# Patient Record
Sex: Female | Born: 1960 | Race: Black or African American | Hispanic: No | Marital: Married | Smoking: Former smoker
Health system: Southern US, Community
[De-identification: ages and names within clinical notes are randomized; demographics above are authoritative.]

## PROBLEM LIST (undated history)

## (undated) DIAGNOSIS — E119 Type 2 diabetes mellitus without complications: Secondary | ICD-10-CM

## (undated) DIAGNOSIS — I10 Essential (primary) hypertension: Secondary | ICD-10-CM

---

## 2005-10-16 ENCOUNTER — Other Ambulatory Visit: Payer: Self-pay

## 2005-10-16 ENCOUNTER — Emergency Department: Payer: Self-pay | Admitting: Emergency Medicine

## 2006-02-09 ENCOUNTER — Emergency Department: Payer: Self-pay | Admitting: Unknown Physician Specialty

## 2006-02-21 ENCOUNTER — Ambulatory Visit: Payer: Self-pay | Admitting: Physician Assistant

## 2006-03-09 ENCOUNTER — Ambulatory Visit: Payer: Self-pay | Admitting: Orthopaedic Surgery

## 2007-03-10 IMAGING — CT CT CHEST W/ CM
1 series · 15 of 32 positions shown, 19 images · non-contrast
Comparison: none

REASON FOR EXAM: pe protcol high d dimer :rm 19
COMMENTS:

[Series 4: soft tissue · axial · 0.62mm/px · z∈[-596,-344]mm · 15 of 96 slices shown, 19 images]
[im 8/96  mediastinal]
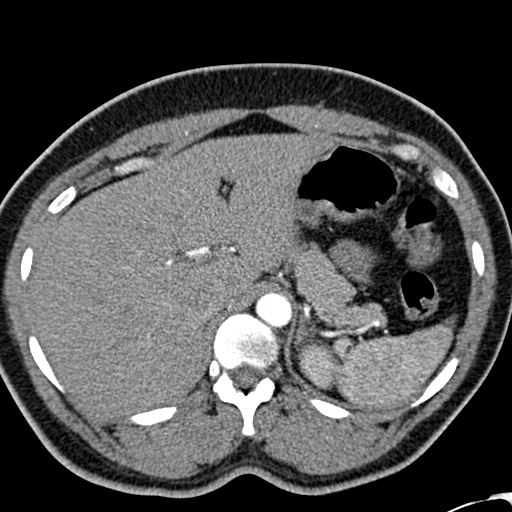
[im 8/96  lung]
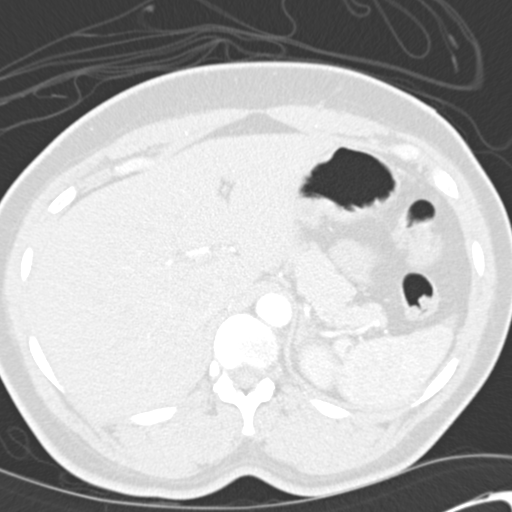
[im 15/96  lung]
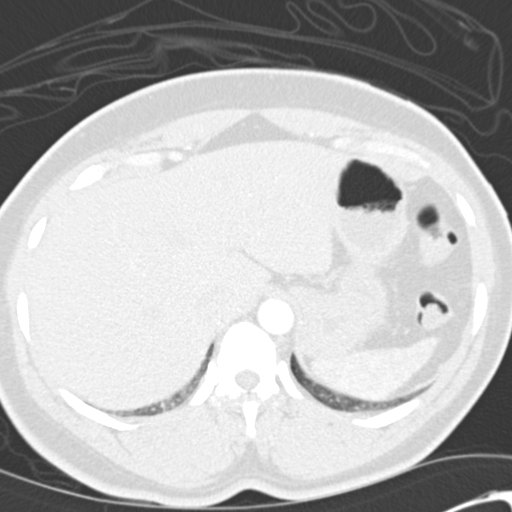
[im 20/96  lung]
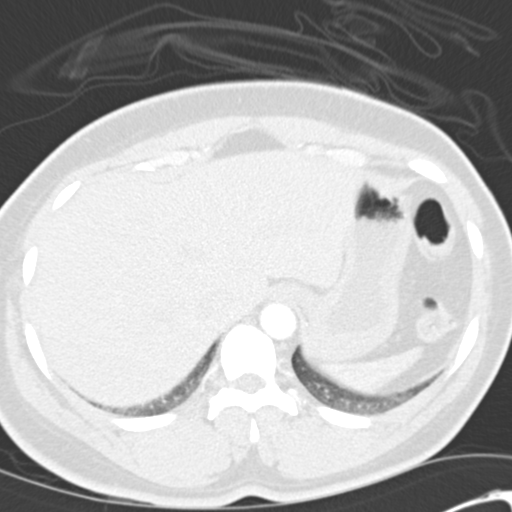
[im 25/96  lung]
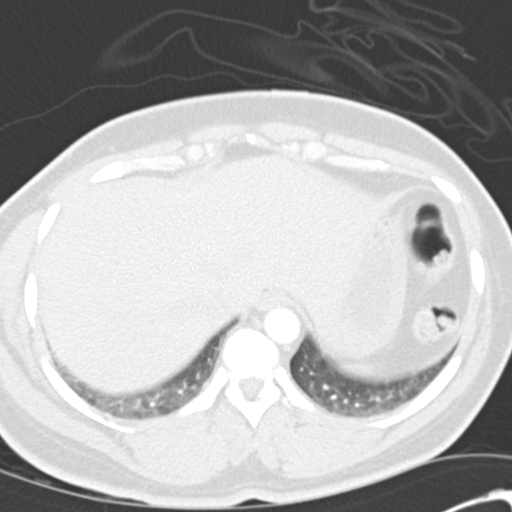
[im 32/96  mediastinal]
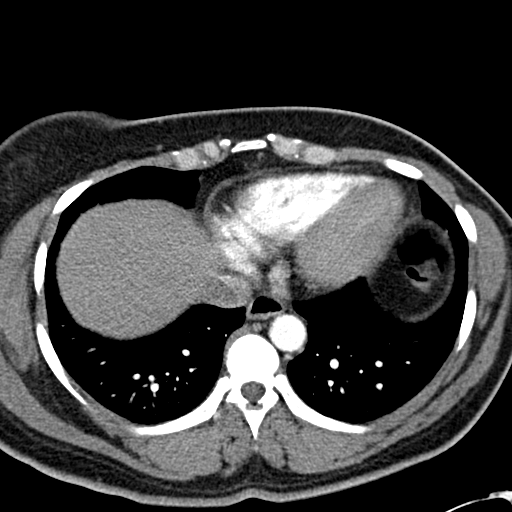
[im 32/96  lung]
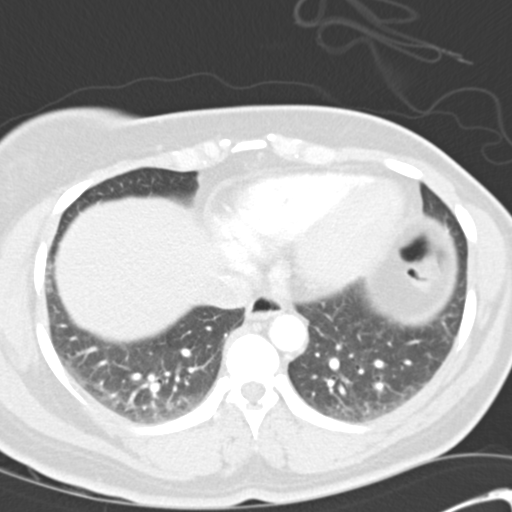
[im 39/96  lung]
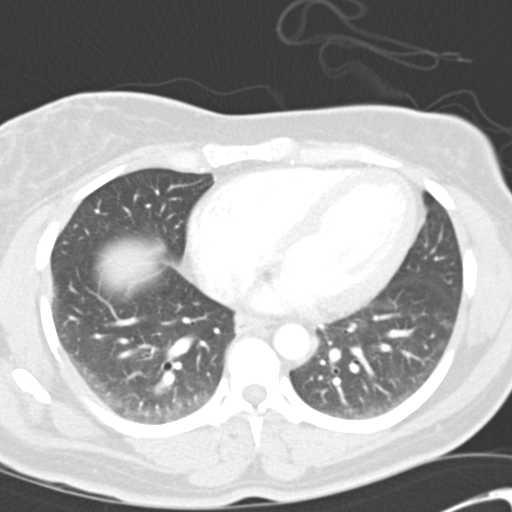
[im 46/96  lung]
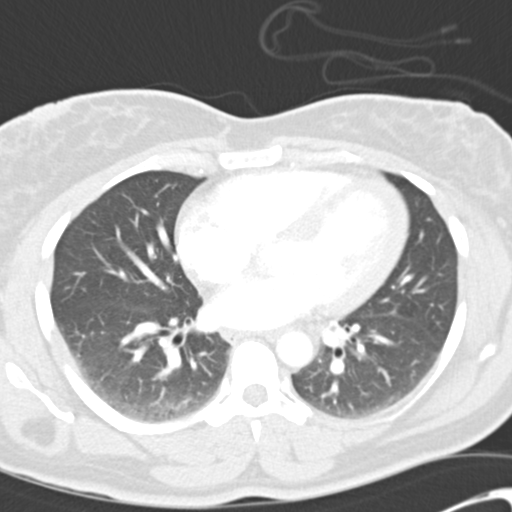
[im 51/96  lung]
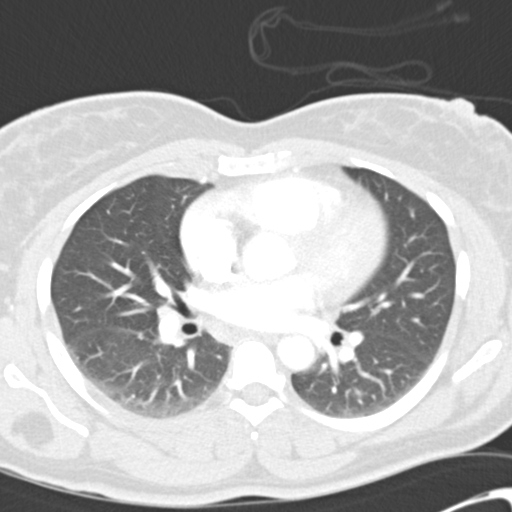
[im 57/96  mediastinal]
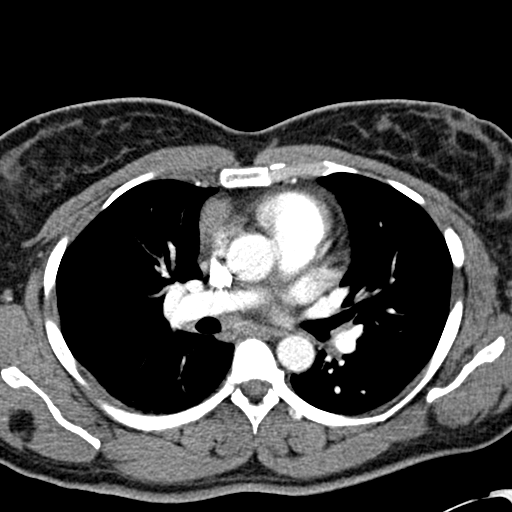
[im 57/96  lung]
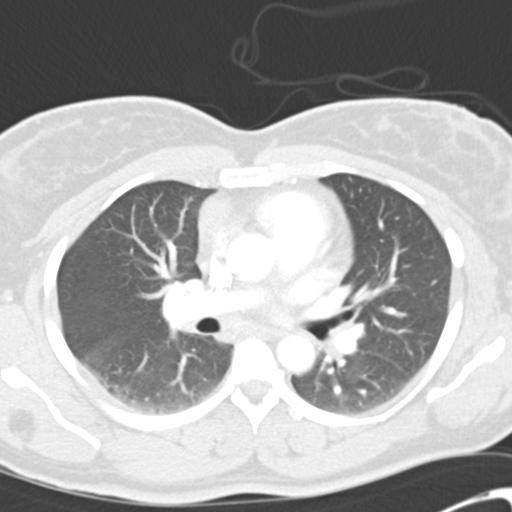
[im 60/96  lung]
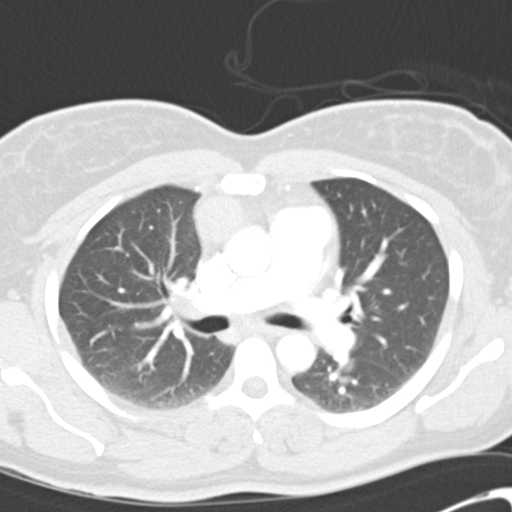
[im 67/96  lung]
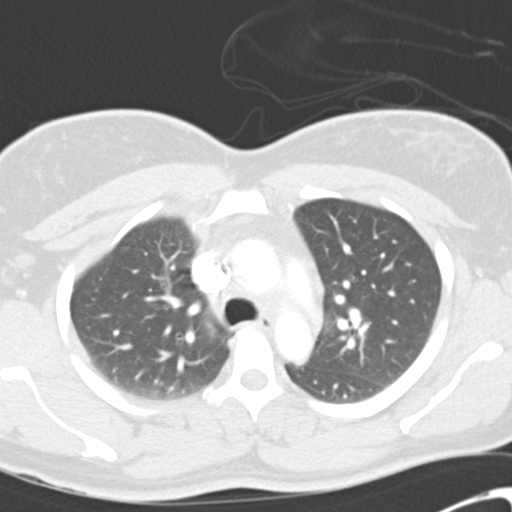
[im 74/96  lung]
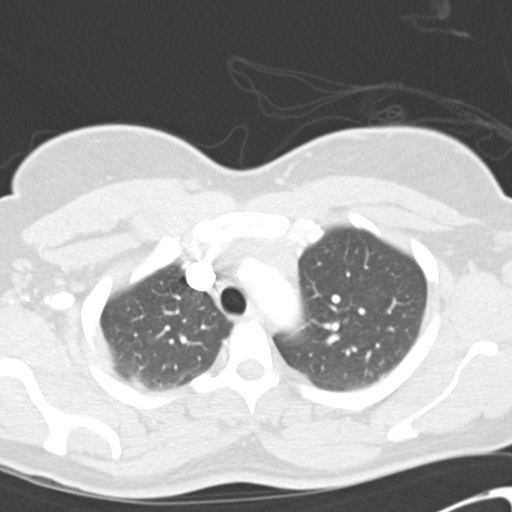
[im 78/96  mediastinal]
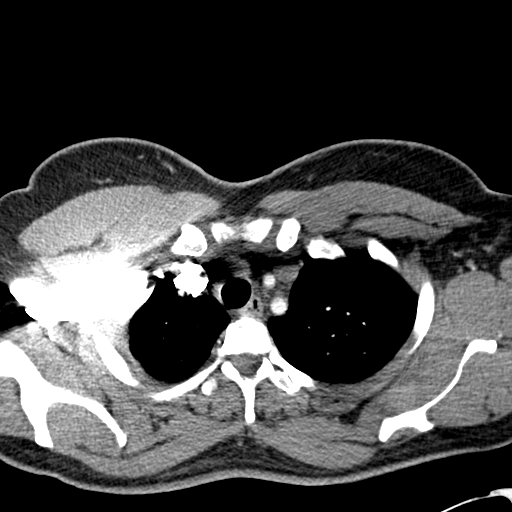
[im 78/96  lung]
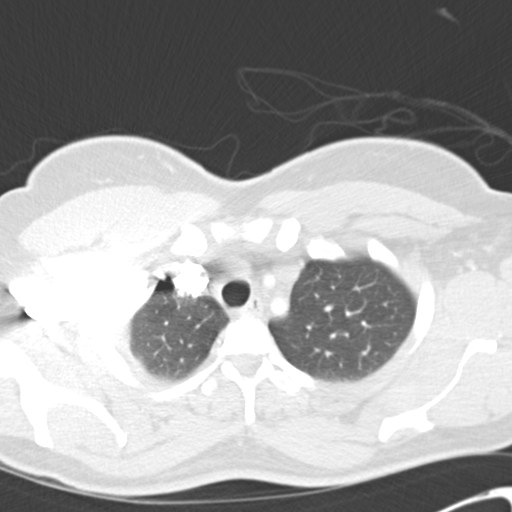
[im 85/96  lung]
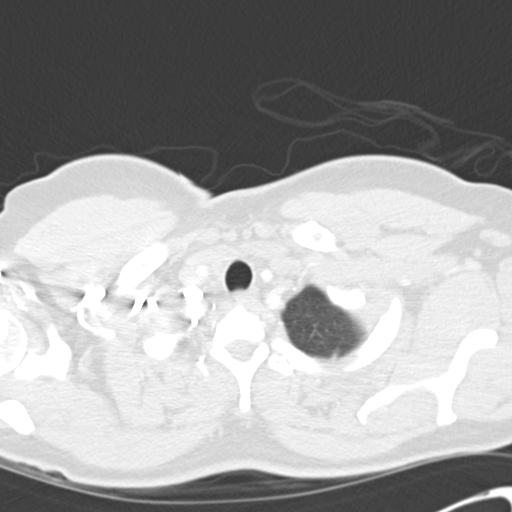
[im 92/96  lung]
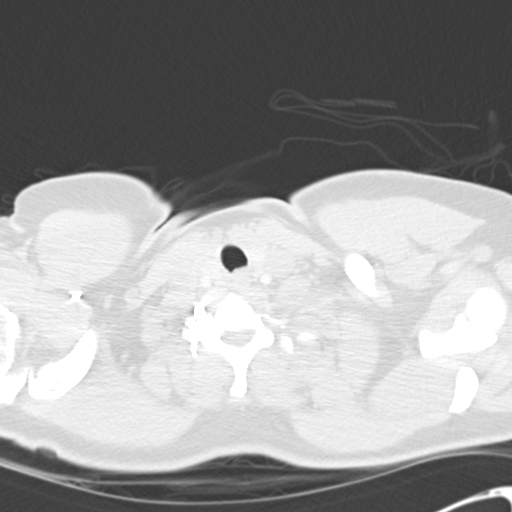

[15 of 32 positions shown; findings below may reference images not displayed]

PROCEDURE:     CT  - CT CHEST (FOR PE) W  - October 16, 2005  [DATE]

RESULT:     This patient has chest discomfort and dyspnea and elevated
D.Dimer.  The patient received 100 ml of Isovue 370 for this study.

Contrast within the pulmonary arterial tree is within the limits of normal.
I do not see findings suspicious for an acute pulmonary embolism.  The
cardiac chambers are mildly enlarged.  There is an approximately 3 x 3 x 3
cm diameter retrosternal soft tissue mass which may reflect lymphadenopathy.
 There is fullness in the RIGHT hilar region without evidence of a definite
bulky mass.  Mild soft tissue fullness in the subcarinal region is seen.
Borderline enlarged lymph nodes in the AP window are present.  There are
enlarged supraclavicular lymph nodes present especially on the LEFT.  A few
enlarged RIGHT hilar lymph nodes are visible.

The caliber of the thoracic aorta is normal.  There is no pleural nor
pericardial effusion. At lung window settings I see no interstitial nor
alveolar infiltrates. Within the upper abdomen the observed portions of the
liver and spleen appear normal.
IMPRESSION: 1)I do not see evidence of an acute pulmonary embolism.

2)There are enlarged lymph nodes in the supraclavicular region as well as in
the retrosternal region.  Borderline enlarged lymph nodes in the RIGHT hilum
and elsewhere in the mediastinum are seen as well as in the RIGHT axillary
region.

3)The cardiac chambers appear mildly enlarged without overt evidence of
pulmonary interstitial or alveolar edema.

4)There is no pleural nor pericardial effusion.

5)Given the lymphadenopathy present, processes such as lymphoma or
sarcoidosis could be present.  The findings will merit further clinical
follow-up. PET imaging may be indicated.

A preliminary report was sent to the [HOSPITAL] the conclusion
of the study.

## 2007-03-10 IMAGING — CR DG CHEST 2V
1 series · 2 of 2 positions shown · non-contrast
Comparison: none

REASON FOR EXAM: Chest pain. Rm 19
COMMENTS:

PROCEDURE:     DXR - DXR CHEST PA (OR AP) AND LATERAL  - October 16, 2005  [DATE]
RESULT:     The lungs are adequately inflated.  The interstitial markings
are increased.  The heart is top normal in size.  There is no pleural
effusion.  The perihilar lung markings are increased.

[Series 1: view not recorded · 0.17mm/px · 2 of 2 slices shown]
[im 1/2]
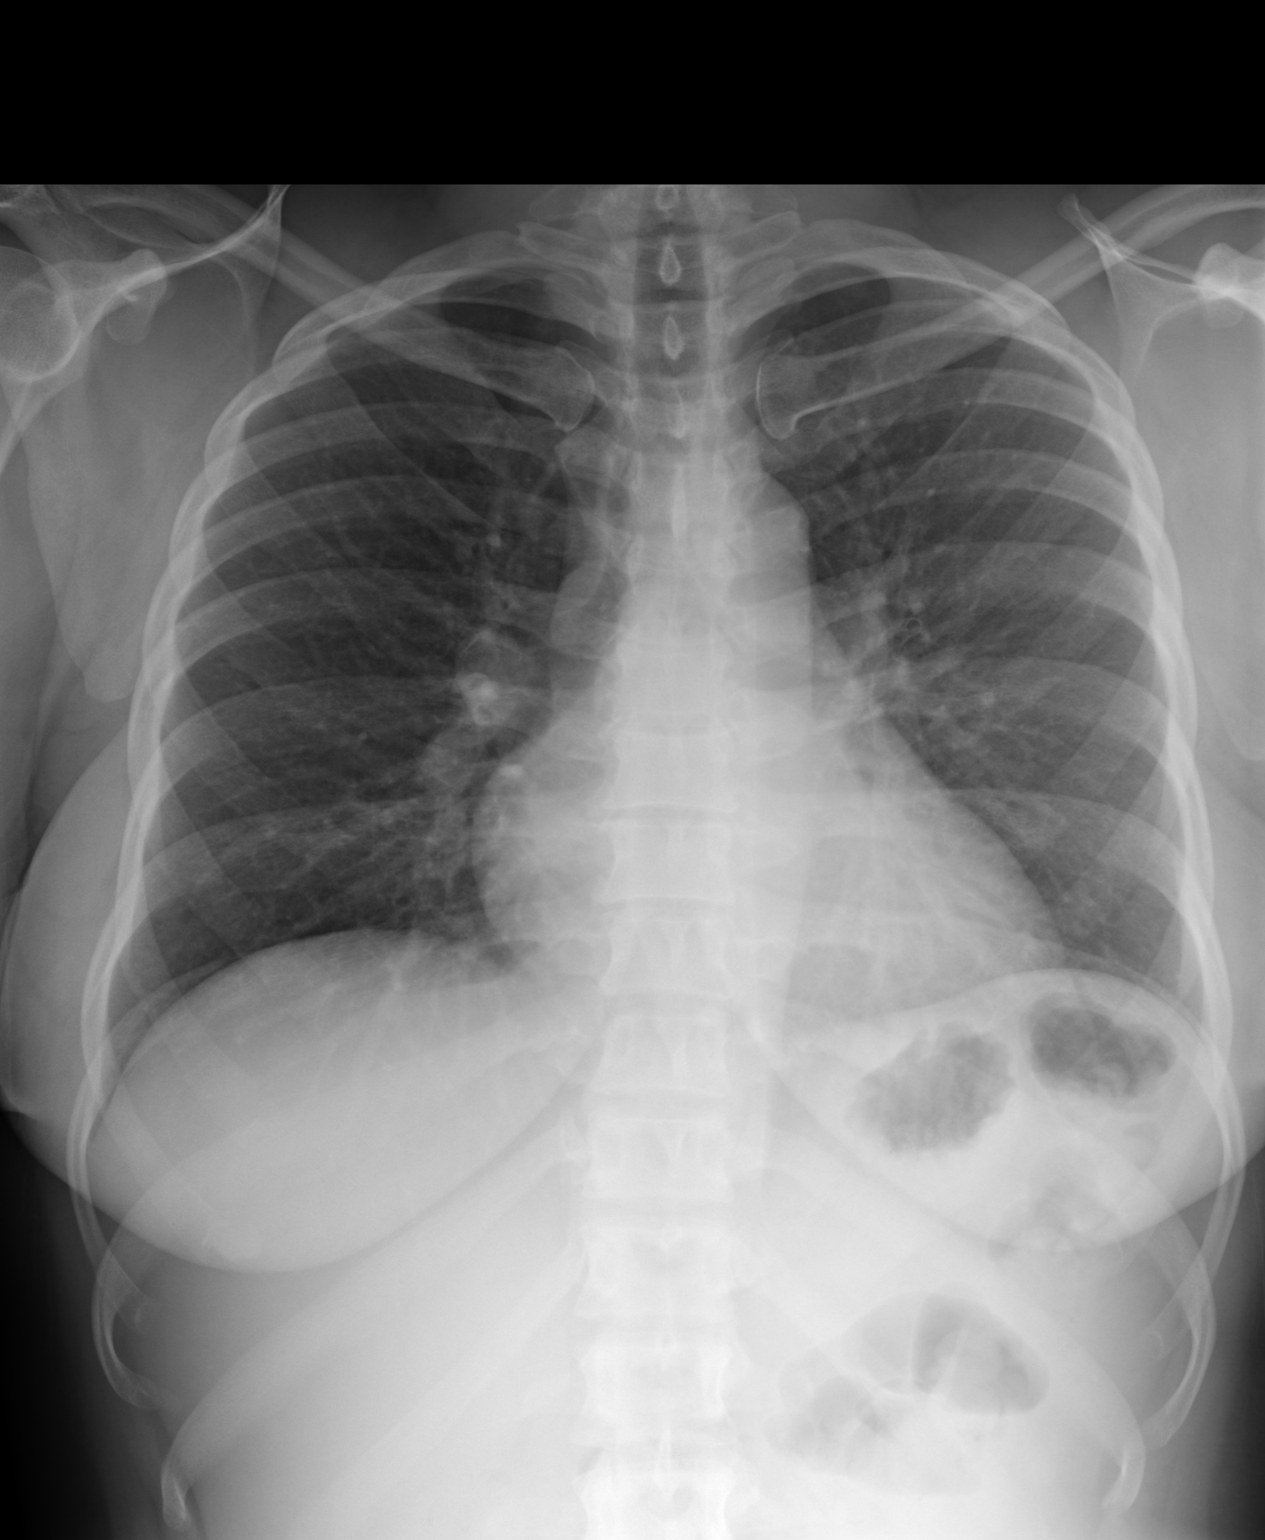
[im 2/2]
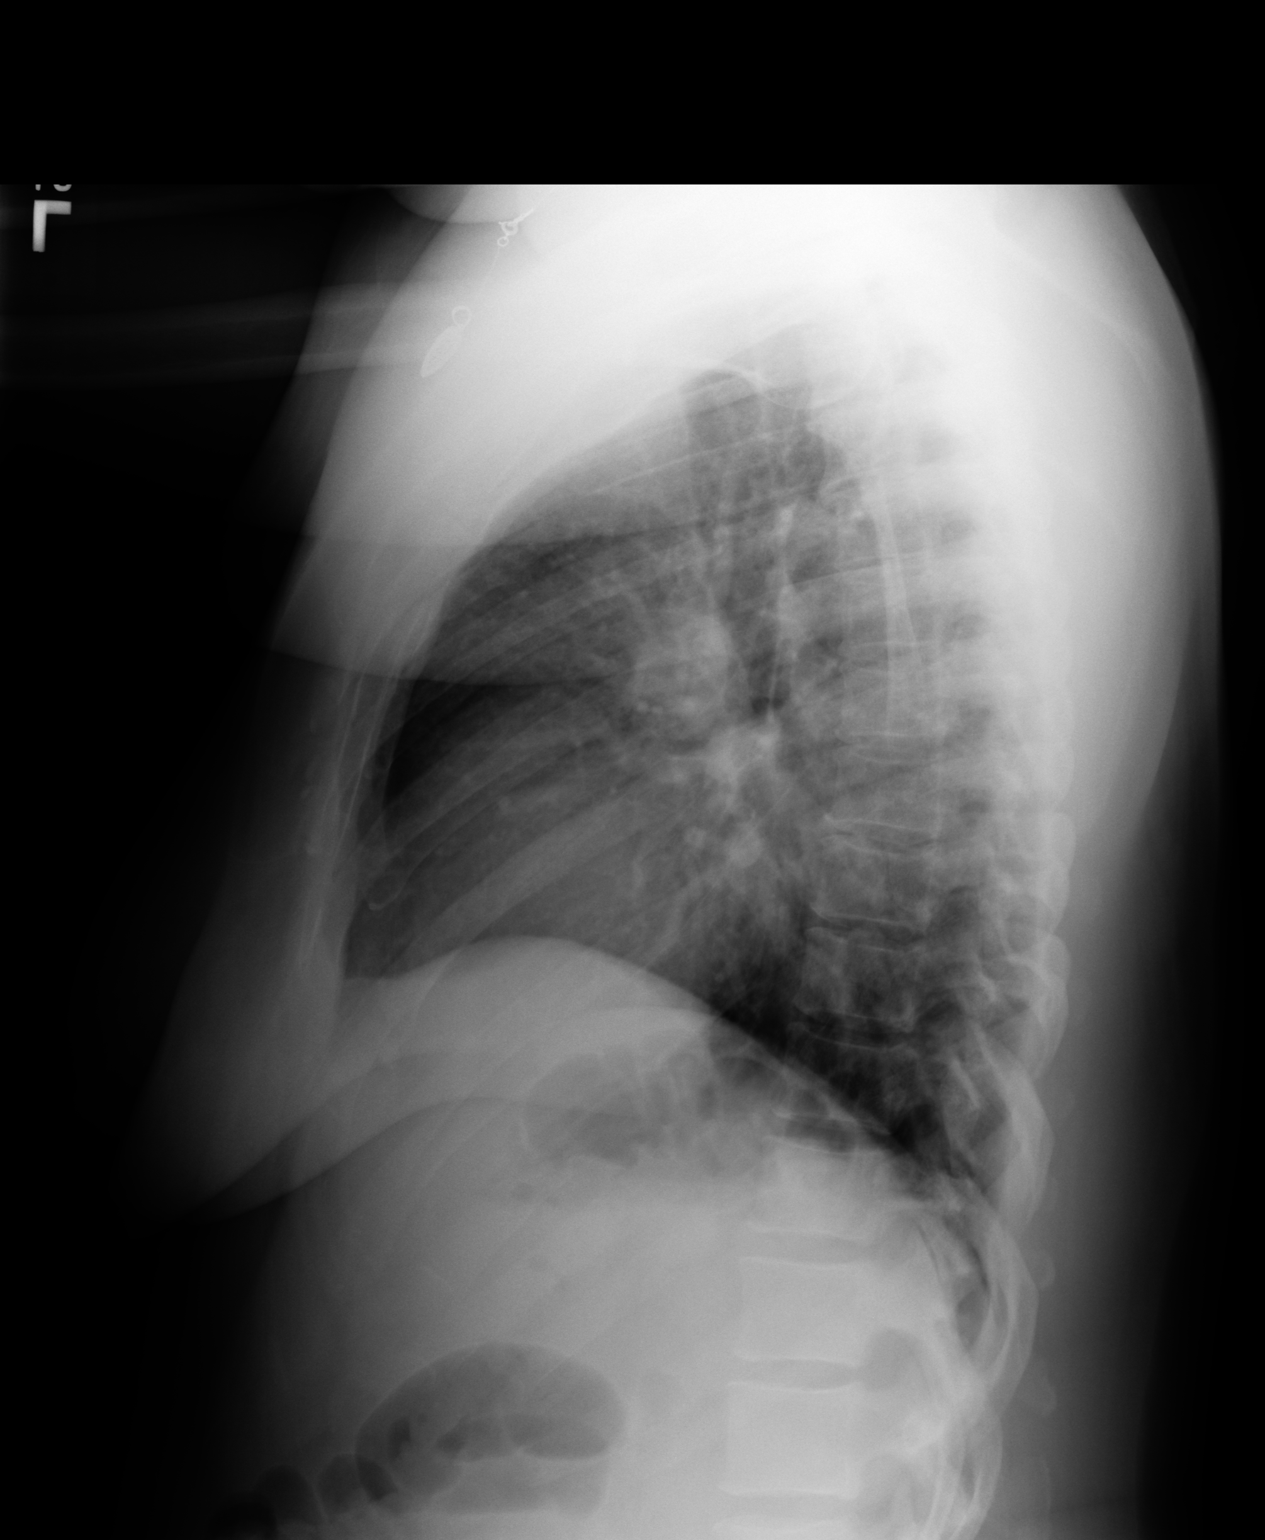

[2 of 2 positions shown; findings below may reference images not displayed]

IMPRESSION: There are findings which likely reflect reactive airway disease and an
element of acute bronchitis.  I see no discrete focal pneumonia.  Follow-up
films are recommended.

## 2011-04-29 ENCOUNTER — Emergency Department: Payer: Self-pay | Admitting: Emergency Medicine

## 2013-03-17 ENCOUNTER — Emergency Department: Payer: Self-pay | Admitting: Emergency Medicine

## 2013-03-24 ENCOUNTER — Emergency Department: Payer: Self-pay | Admitting: Internal Medicine

## 2013-08-31 ENCOUNTER — Ambulatory Visit: Payer: Self-pay | Admitting: Family Medicine

## 2013-09-01 HISTORY — PX: BREAST BIOPSY: SHX20

## 2013-09-06 ENCOUNTER — Ambulatory Visit: Payer: Self-pay | Admitting: Family Medicine

## 2013-09-19 ENCOUNTER — Ambulatory Visit: Payer: Self-pay | Admitting: Family Medicine

## 2013-09-20 LAB — PATHOLOGY REPORT

## 2013-10-06 ENCOUNTER — Ambulatory Visit: Payer: Self-pay | Admitting: Family Medicine

## 2013-10-30 ENCOUNTER — Ambulatory Visit: Payer: Self-pay | Admitting: Family Medicine

## 2014-03-07 ENCOUNTER — Emergency Department: Payer: Self-pay | Admitting: Emergency Medicine

## 2014-03-07 LAB — BASIC METABOLIC PANEL
Anion Gap: 6 — ABNORMAL LOW (ref 7–16)
BUN: 11 mg/dL (ref 7–18)
CALCIUM: 9.2 mg/dL (ref 8.5–10.1)
CHLORIDE: 101 mmol/L (ref 98–107)
CREATININE: 0.8 mg/dL (ref 0.60–1.30)
Co2: 27 mmol/L (ref 21–32)
EGFR (African American): >60
EGFR (Non-African Amer.): >60
GLUCOSE: 236 mg/dL — AB (ref 65–99)
Osmolality: 275 (ref 275–301)
Potassium: 3.6 mmol/L (ref 3.5–5.1)
Sodium: 134 mmol/L — ABNORMAL LOW (ref 136–145)

## 2014-03-07 LAB — CBC WITH DIFFERENTIAL/PLATELET
BASOS ABS: 0 10*3/uL (ref 0.0–0.1)
BASOS PCT: 0.5 %
Eosinophil #: 0.1 10*3/uL (ref 0.0–0.7)
Eosinophil %: 1 %
HCT: 39.1 % (ref 35.0–47.0)
HGB: 12.5 g/dL (ref 12.0–16.0)
Lymphocyte #: 2.2 10*3/uL (ref 1.0–3.6)
Lymphocyte %: 31.9 %
MCH: 25.3 pg — ABNORMAL LOW (ref 26.0–34.0)
MCHC: 31.9 g/dL — AB (ref 32.0–36.0)
MCV: 79 fL — ABNORMAL LOW (ref 80–100)
Monocyte #: 0.4 x10 3/mm (ref 0.2–0.9)
Monocyte %: 6.2 %
Neutrophil #: 4.1 10*3/uL (ref 1.4–6.5)
Neutrophil %: 60.4 %
PLATELETS: 261 10*3/uL (ref 150–440)
RBC: 4.94 10*6/uL (ref 3.80–5.20)
RDW: 14.6 % — ABNORMAL HIGH (ref 11.5–14.5)
WBC: 6.8 10*3/uL (ref 3.6–11.0)

## 2014-03-07 LAB — D-DIMER(ARMC): D-Dimer: 273 ng/ml

## 2014-03-14 ENCOUNTER — Ambulatory Visit: Payer: Self-pay | Admitting: Internal Medicine

## 2014-03-14 LAB — BASIC METABOLIC PANEL
Anion Gap: 7 (ref 7–16)
BUN: 7 mg/dL (ref 7–18)
CREATININE: 0.76 mg/dL (ref 0.60–1.30)
Calcium, Total: 9.1 mg/dL (ref 8.5–10.1)
Chloride: 103 mmol/L (ref 98–107)
Co2: 28 mmol/L (ref 21–32)
EGFR (African American): >60
EGFR (Non-African Amer.): >60
Glucose: 199 mg/dL — ABNORMAL HIGH (ref 65–99)
Osmolality: 279 (ref 275–301)
Potassium: 3.6 mmol/L (ref 3.5–5.1)
Sodium: 138 mmol/L (ref 136–145)

## 2018-05-27 ENCOUNTER — Ambulatory Visit: Payer: BLUE CROSS/BLUE SHIELD | Admitting: Podiatry

## 2018-05-27 ENCOUNTER — Encounter: Payer: Self-pay | Admitting: Podiatry

## 2018-05-27 DIAGNOSIS — E119 Type 2 diabetes mellitus without complications: Secondary | ICD-10-CM

## 2018-05-27 DIAGNOSIS — L84 Corns and callosities: Secondary | ICD-10-CM | POA: Diagnosis not present

## 2018-05-28 ENCOUNTER — Encounter: Payer: Self-pay | Admitting: Podiatry

## 2018-05-28 NOTE — Progress Notes (Signed)
This patient presents to the office with chief complaint of painful calluses on both big toes.  She says that these calluses have become thick and painful over the last 3 to 4 months.  It makes it more difficult for her to walk especially at work in her steel toed shoes.  She does have a history of diabetes type 2.  She has provided no self treatment nor sought any professional help.  She says she went to the salon 2 weeks ago to have her calluses trimmed but they have returned.  She presents the office today for an evaluation and treatment of her feet.  Vascular  Dorsalis pedis and posterior tibial pulses are palpable  B/L.  Capillary return  WNL.  Temperature gradient is  WNL.  Skin turgor  WNL  Sensorium  Senn Weinstein monofilament wire  WNL. Normal tactile sensation.  Nail Exam  Patient has normal nails with no evidence of bacterial or fungal infection.  Orthopedic  Exam  Muscle tone and muscle strength  WNL.  No limitations of motion feet  B/L.  No crepitus or joint effusion noted.  Foot type is unremarkable and digits show no abnormalities.  Bony prominences are unremarkable.  Skin  No open lesions.  Normal skin texture and turgor.  Thick callus noted on the medial aspect of the IPJ bilaterally.  Callus hallux  B/L.  IE  Debride callus.  Discussed this condition with this patient.  Due to the severity of the callus I cannot believe it has only been 4 months and developting.  Therefore I debrided the callus and had her instructed to return to the office in 4 weeks for further evaluation and treatment.  If the callus develops as bad as she said an x-ray will be taken and she will be worked up for this callus formation.  RTC prn   Helane Gunther DPM

## 2018-06-24 ENCOUNTER — Ambulatory Visit: Payer: BLUE CROSS/BLUE SHIELD | Admitting: Podiatry

## 2018-07-12 ENCOUNTER — Encounter: Payer: Self-pay | Admitting: Podiatry

## 2018-07-12 ENCOUNTER — Ambulatory Visit (INDEPENDENT_AMBULATORY_CARE_PROVIDER_SITE_OTHER): Payer: BLUE CROSS/BLUE SHIELD

## 2018-07-12 ENCOUNTER — Ambulatory Visit (INDEPENDENT_AMBULATORY_CARE_PROVIDER_SITE_OTHER): Payer: BLUE CROSS/BLUE SHIELD | Admitting: Podiatry

## 2018-07-12 DIAGNOSIS — M129 Arthropathy, unspecified: Secondary | ICD-10-CM | POA: Diagnosis not present

## 2018-07-12 DIAGNOSIS — L84 Corns and callosities: Secondary | ICD-10-CM

## 2018-07-12 DIAGNOSIS — E119 Type 2 diabetes mellitus without complications: Secondary | ICD-10-CM

## 2018-07-12 NOTE — Progress Notes (Signed)
This patient presents to the office for continued evaluation of her pinch callus both feet.  She was seen 4 weeks ago and the callus were debrided.  The callus has already returned and there is significant pain right hallux.  Patient is diabetic.  She ptresents to the office for evaluation of pinch callus right foot.  General Appearance  Alert, conversant and in no acute stress.  Vascular  Dorsalis pedis and posterior tibial  pulses are palpable  bilaterally.  Capillary return is within normal limits  bilaterally. Temperature is within normal limits  bilaterally.  Neurologic  Senn-Weinstein monofilament wire test within normal limits  bilaterally. Muscle power within normal limits bilaterally.  Nails Thick disfigured discolored nails with subungual debris  from hallux to fifth toes bilaterally. No evidence of bacterial infection or drainage bilaterally.  Orthopedic  No limitations of motion  feet .  No crepitus or effusions noted.  Mild bunion 1st MPJ  Right greater than left.    Skin  normotropic skin with no porokeratosis noted bilaterally.  No signs of infections or ulcers noted.  Thick callus noted on the medial aspect IPJ  B/L.  Callus IPJ  B/L  ROV.   X-rays taken reveal joint narrowing in the first MPJ.  Hallux interphalangeus  IPJ.  Debridement of callus  B/L.  Discussed this condition with this patient.  We discussed conservative treatment versus surgical treatment.  I recommended that she make an appointment with Dr. Al Corpus for an evaluation.  He will then rommended conservative treatment versus surgical treatment.  RTC 4 weeks for continued preventative foot care services.   Helane Gunther DPM

## 2018-08-09 ENCOUNTER — Ambulatory Visit: Payer: BLUE CROSS/BLUE SHIELD | Admitting: Podiatry

## 2018-08-11 ENCOUNTER — Ambulatory Visit: Payer: BLUE CROSS/BLUE SHIELD | Admitting: Podiatry

## 2019-01-05 ENCOUNTER — Other Ambulatory Visit: Payer: Self-pay | Admitting: Physician Assistant

## 2019-01-05 DIAGNOSIS — R202 Paresthesia of skin: Secondary | ICD-10-CM

## 2019-01-13 ENCOUNTER — Ambulatory Visit
Admission: RE | Admit: 2019-01-13 | Discharge: 2019-01-13 | Disposition: A | Discharge status: Home / Self Care | Payer: BLUE CROSS/BLUE SHIELD | Source: Ambulatory Visit | Attending: Physician Assistant | Admitting: Physician Assistant

## 2019-01-13 ENCOUNTER — Other Ambulatory Visit: Payer: Self-pay

## 2019-01-13 ENCOUNTER — Ambulatory Visit
Admission: RE | Admit: 2019-01-13 | Discharge: 2019-01-13 | Disposition: A | Discharge status: Home / Self Care | Payer: BLUE CROSS/BLUE SHIELD | Attending: Physician Assistant | Admitting: Physician Assistant

## 2019-01-13 DIAGNOSIS — R202 Paresthesia of skin: Secondary | ICD-10-CM | POA: Diagnosis not present

## 2019-01-19 ENCOUNTER — Other Ambulatory Visit: Payer: Self-pay | Admitting: Neurology

## 2019-01-19 DIAGNOSIS — M792 Neuralgia and neuritis, unspecified: Secondary | ICD-10-CM

## 2019-01-31 ENCOUNTER — Ambulatory Visit: Payer: BLUE CROSS/BLUE SHIELD

## 2019-02-11 ENCOUNTER — Ambulatory Visit
Admission: RE | Admit: 2019-02-11 | Discharge: 2019-02-11 | Disposition: A | Discharge status: Home / Self Care | Payer: BC Managed Care – PPO | Source: Ambulatory Visit | Attending: Neurology | Admitting: Neurology

## 2019-02-11 ENCOUNTER — Other Ambulatory Visit: Payer: Self-pay

## 2019-02-11 DIAGNOSIS — M792 Neuralgia and neuritis, unspecified: Secondary | ICD-10-CM | POA: Diagnosis not present

## 2020-07-05 IMAGING — MR MRI LUMBAR SPINE WITHOUT CONTRAST
5 series · 31 of 48 positions shown · non-contrast
Comparison: None.

CLINICAL DATA: Fell in [REDACTED] with subsequent low
back pain in tingling of the right leg.

EXAM:
MRI LUMBAR SPINE WITHOUT CONTRAST
TECHNIQUE: Multiplanar, multisequence MR imaging of the lumbar spine was
performed. No intravenous contrast was administered.

[Series 5: T2 · sagittal · 4.0mm · 0.81mm/px · 6 of 17 slices shown (1 of 2)]
[im 1/17]
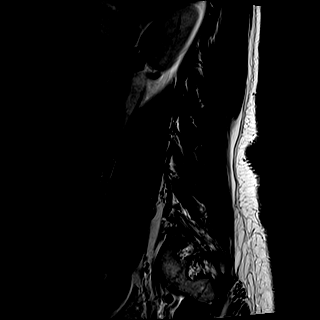
[im 4/17]
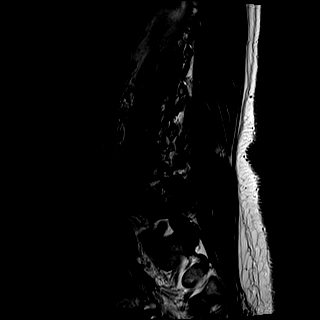
[im 7/17]
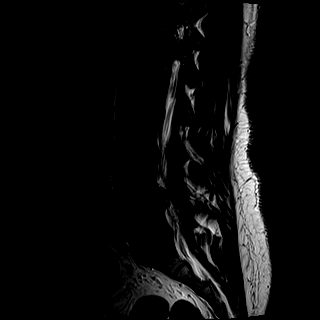
[im 10/17]
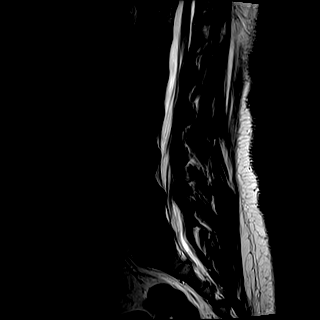
[im 13/17]
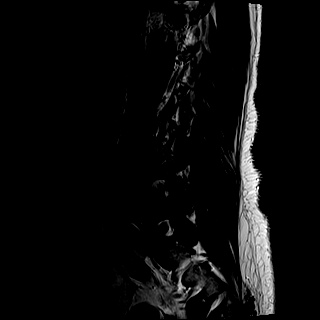
[im 17/17]
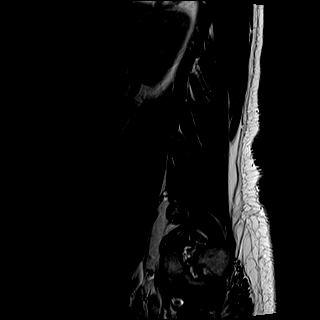

[Series 6: T1 · sagittal · 4.0mm · 0.81mm/px · 7 of 17 slices shown (1 of 2)]
[im 1/17]
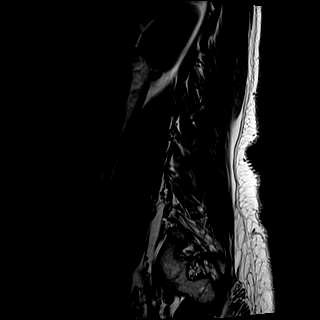
[im 3/17]
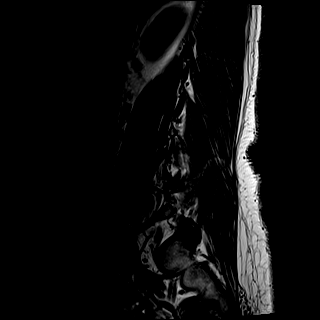
[im 6/17]
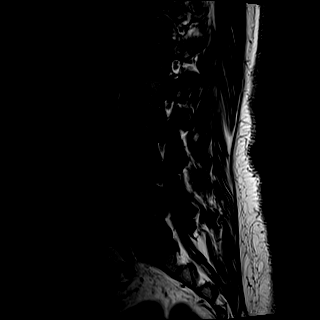
[im 9/17]
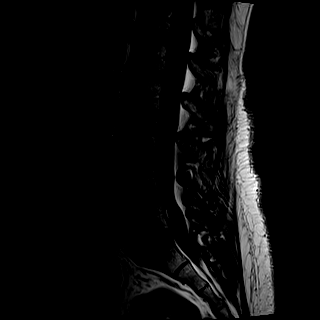
[im 11/17]
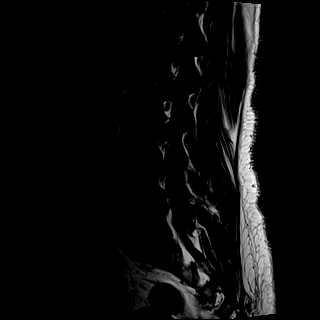
[im 14/17]
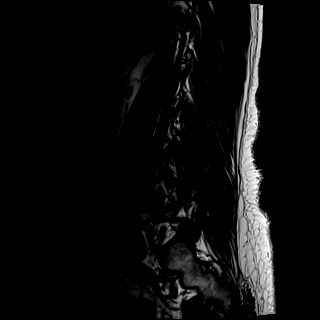
[im 17/17]
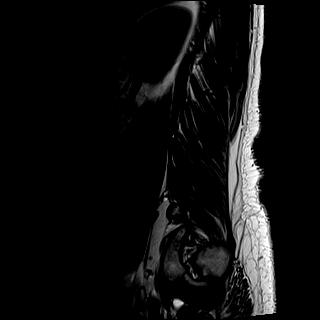

[Series 7: STIR · sagittal · 4.0mm · 0.41mm/px · 2 of 17 slices shown]
[im 1/17]
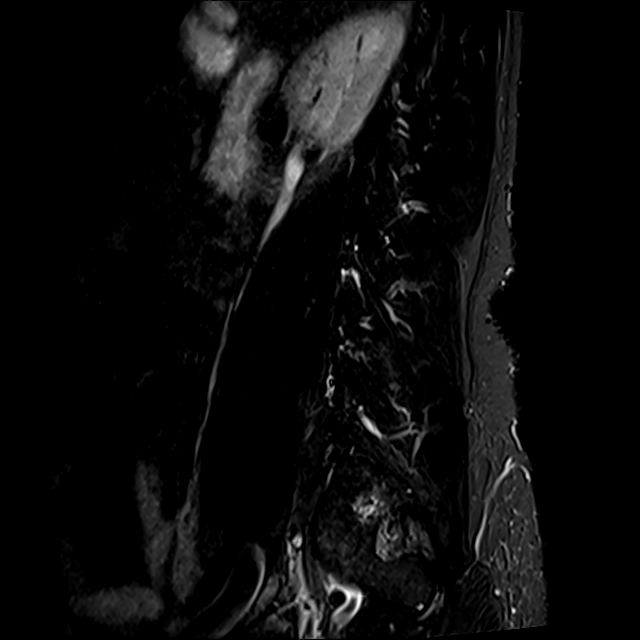
[im 3/17]
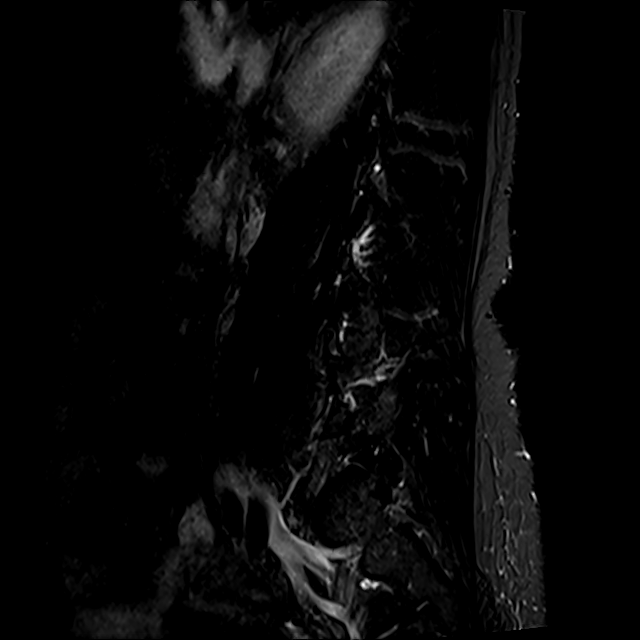

[Series 8: T2 · axial · 4.0mm · 0.78mm/px · z∈[-117,+93]mm · 8 of 37 slices shown (2 of 2)]
[im 1/37]
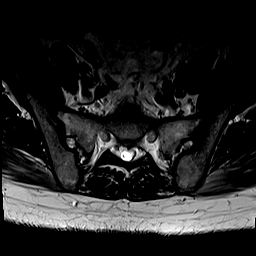
[im 6/37]
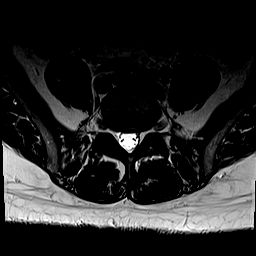
[im 12/37]
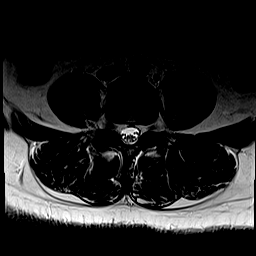
[im 17/37]
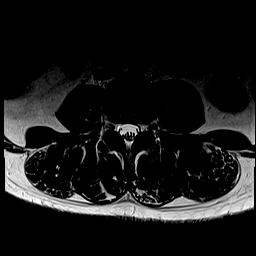
[im 20/37]
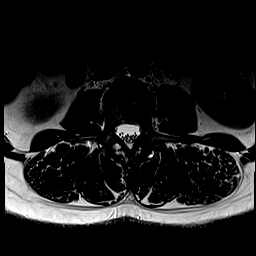
[im 25/37]
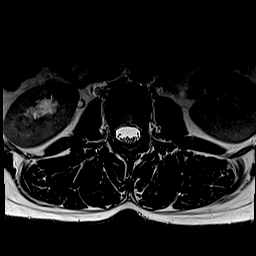
[im 31/37]
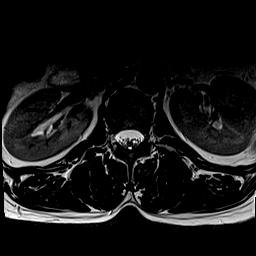
[im 37/37]
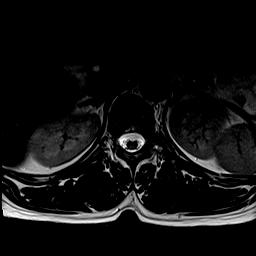

[Series 9: T1 · axial · 4.0mm · 0.39mm/px · z∈[-117,+93]mm · 8 of 37 slices shown (2 of 2)]
[im 1/37]
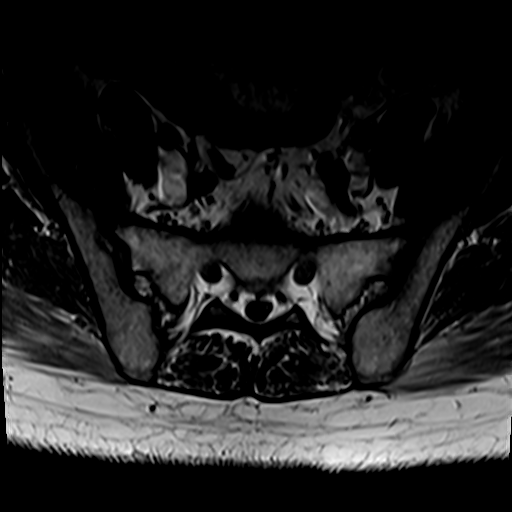
[im 6/37]
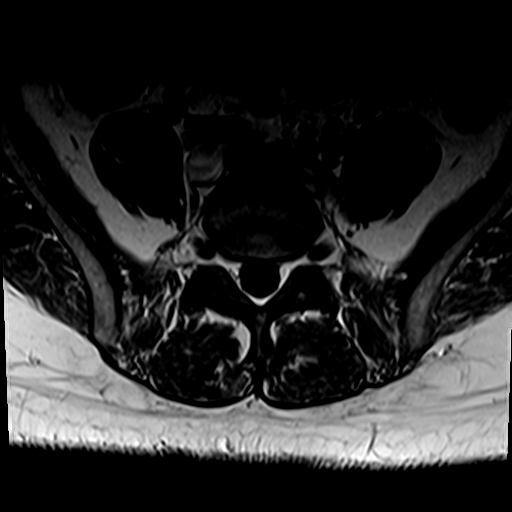
[im 12/37]
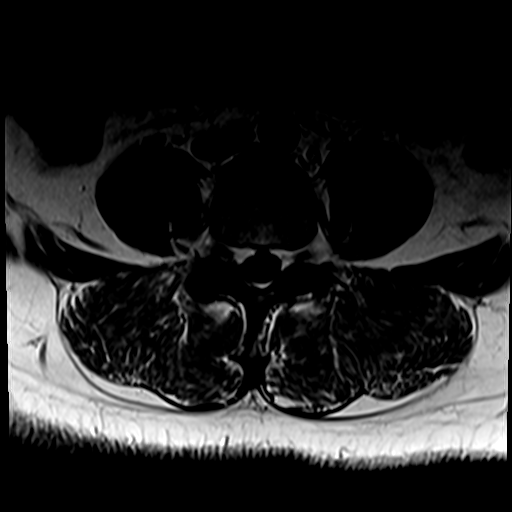
[im 17/37]
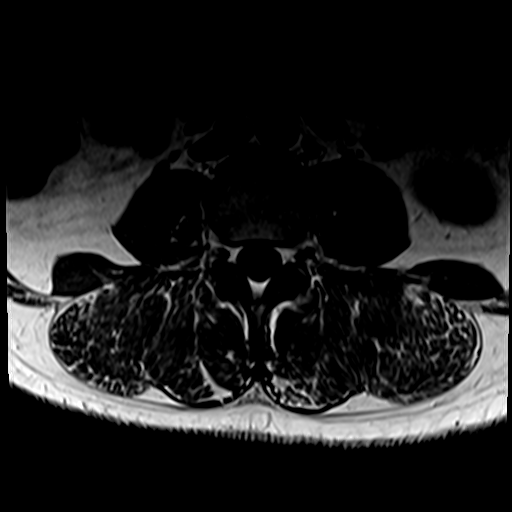
[im 20/37]
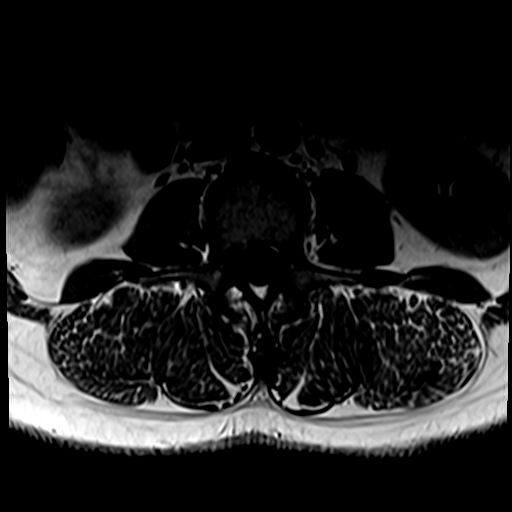
[im 25/37]
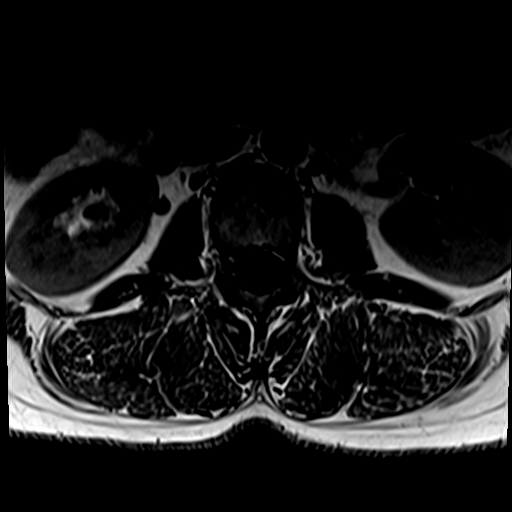
[im 31/37]
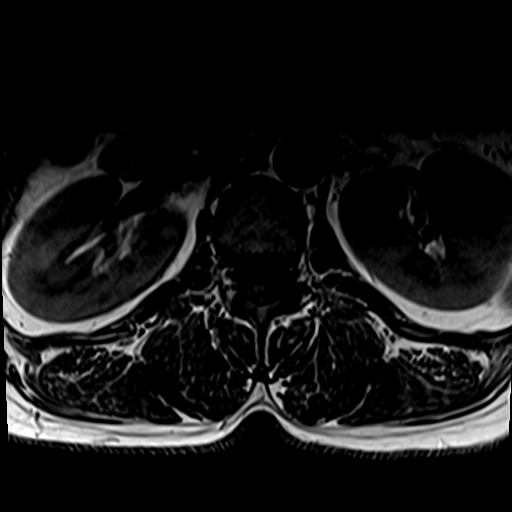
[im 37/37]
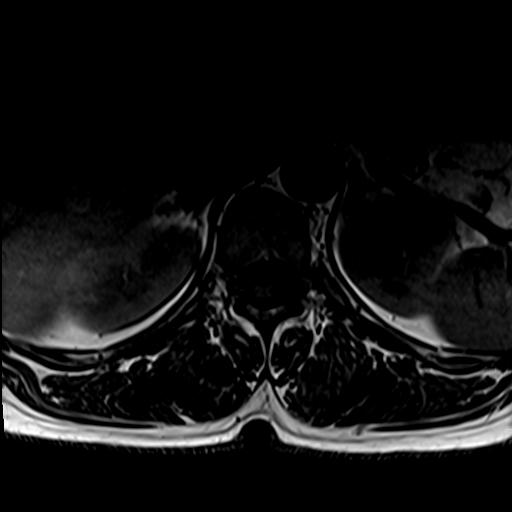

[31 of 48 positions shown; findings below may reference images not displayed]

FINDINGS: Segmentation:  5 lumbar type vertebral bodies assumed.

Alignment:  3 mm anterolisthesis L4-5.

Vertebrae:  No vertebral body fracture or primary bone lesion.

Conus medullaris and cauda equina: Conus extends to the L1-2 level.
Conus and cauda equina appear normal.

Paraspinal and other soft tissues: Negative

Disc levels:

No abnormality at L2-3 or above.

L3-4: Bulging of the disc. Small left extraforaminal annular fissure
and disc protrusion adjacent to the left L3 nerve could irritate
that structure. Mild facet hypertrophy at this level.

L4-5: Advanced bilateral facet arthropathy allowing 3 mm of
anterolisthesis. Edematous change, more on the right than the left.
Circumferential bulging of the disc. Stenosis of the lateral
recesses and foramina, right more than left. Neural compression
could occur at this level, particularly on the right. Facet
arthropathy could also be a cause of back pain or referred facet
syndrome pain.

L5-S1: Normal appearance of the disc. Mild facet degeneration. No
stenosis.
IMPRESSION: L4-5: Advanced bilateral facet arthropathy with edematous change. 3
mm of anterolisthesis. Bulging of the disc more towards the right.
Stenosis of both lateral recesses and foramina right more than left.
Neural compression could occur on either or both sides, more likely
the right. Additionally, the facet arthropathy could be a cause of
back pain or referred facet syndrome pain.

L3-4: Mild disc bulge. Small annular fissure in the left
extraforaminal region adjacent to the left L3 nerve. This would have
potential to irritate the left L3 nerve.

Mild facet degeneration at L3-4 and L5-S1.

## 2022-04-24 ENCOUNTER — Other Ambulatory Visit: Payer: Self-pay | Admitting: Family Medicine

## 2022-04-24 DIAGNOSIS — Z1231 Encounter for screening mammogram for malignant neoplasm of breast: Secondary | ICD-10-CM

## 2022-05-26 ENCOUNTER — Ambulatory Visit
Admission: RE | Admit: 2022-05-26 | Discharge: 2022-05-26 | Disposition: A | Discharge status: Home / Self Care | Payer: BC Managed Care – PPO | Source: Ambulatory Visit | Attending: Family Medicine | Admitting: Family Medicine

## 2022-05-26 ENCOUNTER — Encounter: Payer: Self-pay | Admitting: Radiology

## 2022-05-26 DIAGNOSIS — Z1231 Encounter for screening mammogram for malignant neoplasm of breast: Secondary | ICD-10-CM | POA: Diagnosis not present

## 2022-06-03 ENCOUNTER — Encounter: Payer: Self-pay | Admitting: Emergency Medicine

## 2022-06-03 ENCOUNTER — Emergency Department: Payer: BC Managed Care – PPO

## 2022-06-03 ENCOUNTER — Inpatient Hospital Stay
Admission: EM | Admit: 2022-06-03 | Discharge: 2022-06-07 | DRG: 617 | Disposition: A | Discharge status: Home / Self Care | Payer: BC Managed Care – PPO | Source: Ambulatory Visit | Attending: Internal Medicine | Admitting: Internal Medicine

## 2022-06-03 DIAGNOSIS — L03032 Cellulitis of left toe: Secondary | ICD-10-CM | POA: Diagnosis present

## 2022-06-03 DIAGNOSIS — S90822A Blister (nonthermal), left foot, initial encounter: Secondary | ICD-10-CM | POA: Diagnosis present

## 2022-06-03 DIAGNOSIS — Z833 Family history of diabetes mellitus: Secondary | ICD-10-CM

## 2022-06-03 DIAGNOSIS — L84 Corns and callosities: Secondary | ICD-10-CM | POA: Diagnosis present

## 2022-06-03 DIAGNOSIS — L03116 Cellulitis of left lower limb: Principal | ICD-10-CM

## 2022-06-03 DIAGNOSIS — E1169 Type 2 diabetes mellitus with other specified complication: Principal | ICD-10-CM | POA: Diagnosis present

## 2022-06-03 DIAGNOSIS — E11628 Type 2 diabetes mellitus with other skin complications: Secondary | ICD-10-CM | POA: Diagnosis present

## 2022-06-03 DIAGNOSIS — Z87891 Personal history of nicotine dependence: Secondary | ICD-10-CM

## 2022-06-03 DIAGNOSIS — M86172 Other acute osteomyelitis, left ankle and foot: Secondary | ICD-10-CM | POA: Diagnosis present

## 2022-06-03 DIAGNOSIS — L97529 Non-pressure chronic ulcer of other part of left foot with unspecified severity: Secondary | ICD-10-CM | POA: Diagnosis present

## 2022-06-03 DIAGNOSIS — I10 Essential (primary) hypertension: Secondary | ICD-10-CM | POA: Diagnosis present

## 2022-06-03 DIAGNOSIS — L089 Local infection of the skin and subcutaneous tissue, unspecified: Secondary | ICD-10-CM | POA: Diagnosis not present

## 2022-06-03 DIAGNOSIS — E118 Type 2 diabetes mellitus with unspecified complications: Secondary | ICD-10-CM | POA: Diagnosis not present

## 2022-06-03 DIAGNOSIS — E119 Type 2 diabetes mellitus without complications: Secondary | ICD-10-CM

## 2022-06-03 DIAGNOSIS — E669 Obesity, unspecified: Secondary | ICD-10-CM | POA: Diagnosis present

## 2022-06-03 DIAGNOSIS — E11621 Type 2 diabetes mellitus with foot ulcer: Secondary | ICD-10-CM | POA: Diagnosis present

## 2022-06-03 DIAGNOSIS — Z7984 Long term (current) use of oral hypoglycemic drugs: Secondary | ICD-10-CM

## 2022-06-03 DIAGNOSIS — Z66 Do not resuscitate: Secondary | ICD-10-CM | POA: Diagnosis present

## 2022-06-03 DIAGNOSIS — Z8249 Family history of ischemic heart disease and other diseases of the circulatory system: Secondary | ICD-10-CM

## 2022-06-03 DIAGNOSIS — E1165 Type 2 diabetes mellitus with hyperglycemia: Secondary | ICD-10-CM | POA: Diagnosis present

## 2022-06-03 DIAGNOSIS — Z79899 Other long term (current) drug therapy: Secondary | ICD-10-CM

## 2022-06-03 DIAGNOSIS — K08109 Complete loss of teeth, unspecified cause, unspecified class: Secondary | ICD-10-CM | POA: Diagnosis present

## 2022-06-03 DIAGNOSIS — B3749 Other urogenital candidiasis: Secondary | ICD-10-CM | POA: Diagnosis present

## 2022-06-03 DIAGNOSIS — E785 Hyperlipidemia, unspecified: Secondary | ICD-10-CM | POA: Diagnosis present

## 2022-06-03 DIAGNOSIS — Z6830 Body mass index (BMI) 30.0-30.9, adult: Secondary | ICD-10-CM

## 2022-06-03 HISTORY — DX: Type 2 diabetes mellitus without complications: E11.9

## 2022-06-03 HISTORY — DX: Essential (primary) hypertension: I10

## 2022-06-03 LAB — CBC WITH DIFFERENTIAL/PLATELET
Abs Immature Granulocytes: 0.1 10*3/uL — ABNORMAL HIGH (ref 0.00–0.07)
Basophils Absolute: 0 10*3/uL (ref 0.0–0.1)
Basophils Relative: 0 %
Eosinophils Absolute: 0.2 10*3/uL (ref 0.0–0.5)
Eosinophils Relative: 2 %
HCT: 35.8 % — ABNORMAL LOW (ref 36.0–46.0)
Hemoglobin: 11.4 g/dL — ABNORMAL LOW (ref 12.0–15.0)
Immature Granulocytes: 1 %
Lymphocytes Relative: 26 %
Lymphs Abs: 2.7 10*3/uL (ref 0.7–4.0)
MCH: 25.2 pg — ABNORMAL LOW (ref 26.0–34.0)
MCHC: 31.8 g/dL (ref 30.0–36.0)
MCV: 79.2 fL — ABNORMAL LOW (ref 80.0–100.0)
Monocytes Absolute: 0.8 10*3/uL (ref 0.1–1.0)
Monocytes Relative: 8 %
Neutro Abs: 6.3 10*3/uL (ref 1.7–7.7)
Neutrophils Relative %: 63 %
Platelets: 429 10*3/uL — ABNORMAL HIGH (ref 150–400)
RBC: 4.52 MIL/uL (ref 3.87–5.11)
RDW: 15.2 % (ref 11.5–15.5)
WBC: 10.1 10*3/uL (ref 4.0–10.5)
nRBC: 0 % (ref 0.0–0.2)

## 2022-06-03 LAB — COMPREHENSIVE METABOLIC PANEL
ALT: 18 U/L (ref 0–44)
AST: 16 U/L (ref 15–41)
Albumin: 3.8 g/dL (ref 3.5–5.0)
Alkaline Phosphatase: 83 U/L (ref 38–126)
Anion gap: 10 (ref 5–15)
BUN: 9 mg/dL (ref 6–20)
CO2: 26 mmol/L (ref 22–32)
Calcium: 9.3 mg/dL (ref 8.9–10.3)
Chloride: 101 mmol/L (ref 98–111)
Creatinine, Ser: 0.77 mg/dL (ref 0.44–1.00)
GFR, Estimated: >60 mL/min (ref >60)
Glucose, Bld: 152 mg/dL — ABNORMAL HIGH (ref 70–99)
Potassium: 3.6 mmol/L (ref 3.5–5.1)
Sodium: 137 mmol/L (ref 135–145)
Total Bilirubin: 0.5 mg/dL (ref 0.3–1.2)
Total Protein: 8.3 g/dL — ABNORMAL HIGH (ref 6.5–8.1)

## 2022-06-03 LAB — APTT: aPTT: 31 seconds (ref 24–36)

## 2022-06-03 LAB — PROTIME-INR
INR: 1.1 (ref 0.8–1.2)
Prothrombin Time: 13.7 seconds (ref 11.4–15.2)

## 2022-06-03 LAB — LACTIC ACID, PLASMA: Lactic Acid, Venous: 1.1 mmol/L (ref 0.5–1.9)

## 2022-06-03 LAB — CBG MONITORING, ED: Glucose-Capillary: 150 mg/dL — ABNORMAL HIGH (ref 70–99)

## 2022-06-03 MED ORDER — VANCOMYCIN HCL IN DEXTROSE 1-5 GM/200ML-% IV SOLN: 1000.0000 mg | Freq: Once | INTRAVENOUS | Status: DC

## 2022-06-03 MED ORDER — VANCOMYCIN HCL 2000 MG/400ML IV SOLN
2000.0000 mg | INTRAVENOUS | Status: DC
  Administered 2022-06-04: 2000 mg via INTRAVENOUS
  Filled 2022-06-03 (×2): qty 400

## 2022-06-03 MED ORDER — LABETALOL HCL 5 MG/ML IV SOLN: 5.0000 mg | INTRAVENOUS | Status: DC | PRN

## 2022-06-03 MED ORDER — SENNOSIDES-DOCUSATE SODIUM 8.6-50 MG PO TABS: 1.0000 | ORAL_TABLET | Freq: Every evening | ORAL | Status: DC | PRN

## 2022-06-03 MED ORDER — HYDRALAZINE HCL 20 MG/ML IJ SOLN: 5.0000 mg | Freq: Three times a day (TID) | INTRAMUSCULAR | Status: DC | PRN

## 2022-06-03 MED ORDER — ACETAMINOPHEN 325 MG PO TABS
650.0000 mg | ORAL_TABLET | Freq: Four times a day (QID) | ORAL | Status: DC | PRN
  Administered 2022-06-04 – 2022-06-07 (×3): 650 mg via ORAL
  Filled 2022-06-03 (×4): qty 2

## 2022-06-03 MED ORDER — SODIUM CHLORIDE 0.9 % IV SOLN
2.0000 g | Freq: Once | INTRAVENOUS | Status: AC
  Administered 2022-06-03: 2 g via INTRAVENOUS
  Filled 2022-06-03: qty 12.5

## 2022-06-03 MED ORDER — ONDANSETRON HCL 4 MG/2ML IJ SOLN: 4.0000 mg | Freq: Four times a day (QID) | INTRAMUSCULAR | Status: DC | PRN

## 2022-06-03 MED ORDER — AMLODIPINE BESYLATE 5 MG PO TABS
5.0000 mg | ORAL_TABLET | Freq: Once | ORAL | Status: AC
  Administered 2022-06-03: 5 mg via ORAL
  Filled 2022-06-03: qty 1

## 2022-06-03 MED ORDER — SODIUM CHLORIDE 0.9 % IV SOLN
2.0000 g | Freq: Three times a day (TID) | INTRAVENOUS | Status: DC
  Administered 2022-06-04 – 2022-06-07 (×10): 2 g via INTRAVENOUS
  Filled 2022-06-03: qty 2
  Filled 2022-06-03: qty 12.5
  Filled 2022-06-03: qty 2
  Filled 2022-06-03: qty 12.5
  Filled 2022-06-03 (×2): qty 2
  Filled 2022-06-03: qty 12.5
  Filled 2022-06-03 (×3): qty 2
  Filled 2022-06-03: qty 12.5

## 2022-06-03 MED ORDER — AMLODIPINE BESYLATE 5 MG PO TABS: 5.0000 mg | ORAL_TABLET | Freq: Every day | ORAL | Status: DC

## 2022-06-03 MED ORDER — HEPARIN SODIUM (PORCINE) 5000 UNIT/ML IJ SOLN
5000.0000 [IU] | Freq: Three times a day (TID) | INTRAMUSCULAR | Status: DC
  Administered 2022-06-03 – 2022-06-07 (×10): 5000 [IU] via SUBCUTANEOUS
  Filled 2022-06-03 (×10): qty 1

## 2022-06-03 MED ORDER — INSULIN ASPART 100 UNIT/ML IJ SOLN
0.0000 [IU] | Freq: Every day | INTRAMUSCULAR | Status: DC
  Administered 2022-06-05 – 2022-06-06 (×2): 2 [IU] via SUBCUTANEOUS
  Filled 2022-06-03 (×2): qty 1

## 2022-06-03 MED ORDER — HYDRALAZINE HCL 20 MG/ML IJ SOLN
10.0000 mg | Freq: Once | INTRAMUSCULAR | Status: AC
  Administered 2022-06-03: 10 mg via INTRAVENOUS
  Filled 2022-06-03: qty 1

## 2022-06-03 MED ORDER — ACETAMINOPHEN 650 MG RE SUPP: 650.0000 mg | Freq: Four times a day (QID) | RECTAL | Status: DC | PRN

## 2022-06-03 MED ORDER — ONDANSETRON HCL 4 MG PO TABS: 4.0000 mg | ORAL_TABLET | Freq: Four times a day (QID) | ORAL | Status: DC | PRN

## 2022-06-03 MED ORDER — INSULIN ASPART 100 UNIT/ML IJ SOLN
0.0000 [IU] | Freq: Three times a day (TID) | INTRAMUSCULAR | Status: DC
  Administered 2022-06-04 (×2): 3 [IU] via SUBCUTANEOUS
  Administered 2022-06-05: 2 [IU] via SUBCUTANEOUS
  Administered 2022-06-06: 3 [IU] via SUBCUTANEOUS
  Filled 2022-06-03 (×4): qty 1

## 2022-06-03 MED ORDER — LOSARTAN POTASSIUM 50 MG PO TABS
50.0000 mg | ORAL_TABLET | Freq: Every day | ORAL | Status: DC
  Administered 2022-06-04 – 2022-06-07 (×3): 50 mg via ORAL
  Filled 2022-06-03 (×3): qty 1

## 2022-06-03 MED ORDER — ATORVASTATIN CALCIUM 10 MG PO TABS
10.0000 mg | ORAL_TABLET | Freq: Every day | ORAL | Status: DC
  Administered 2022-06-03 – 2022-06-06 (×4): 10 mg via ORAL
  Filled 2022-06-03 (×4): qty 1

## 2022-06-03 MED ORDER — METRONIDAZOLE 500 MG/100ML IV SOLN
500.0000 mg | Freq: Once | INTRAVENOUS | Status: AC
  Administered 2022-06-03: 500 mg via INTRAVENOUS
  Filled 2022-06-03: qty 100

## 2022-06-03 NOTE — Hospital Course (Signed)
Ms. Elexa Kivi is a 61 year old female with history of poorly controlled noninsulin-dependent diabetes mellitus, hyperlipidemia, hypertension, who presents emergency department for chief concerns of left first toe open wound infection.  Initial vitals in the emergency department showed temperature 98.3, respiration rate of 19, heart rate of 88, blood pressure 211 over 92 and improved to 192/91, SPO2 of 96% on room air.  Serum sodium is 137, potassium 3.6, chloride 101, bicarb 26, BUN of 9, serum creatinine of 0.77, nonfasting blood glucose 152, GFR greater than 60, WBC 10.1, hemoglobin 11.4, platelets of 429.  lactic acid 1.1  ED treatment: Amlodipine 5 mg, hydralazine 10 mg IV, cefepime, vancomycin, metronidazole.

## 2022-06-03 NOTE — ED Notes (Signed)
Patient taken to xray at this time.

## 2022-06-03 NOTE — Consult Note (Signed)
PHARMACY -  BRIEF ANTIBIOTIC NOTE   Pharmacy has received consult(s) for cellulitis  from an ED provider.  The patient's profile has been reviewed for ht/wt/allergies/indication/available labs.    One time order(s) placed for cefepime and vancomycin  Further antibiotics/pharmacy consults should be ordered by admitting physician if indicated.                       Thank you, Oswald Hillock, PharmD, BCPS 06/03/2022  8:22 PM

## 2022-06-03 NOTE — Assessment & Plan Note (Addendum)
-   Resumed home losartan 50 mg daily - Labetalol 5 mg IV every 3 hours as needed for SBP greater than 185, 3 doses ordered - Hydralazine 5 mg IV every 8 hours as needed for SBP greater than 180, 3 days ordered starting at 600 on 06/04/2022

## 2022-06-03 NOTE — Consult Note (Signed)
Pharmacy Antibiotic Note  Kristina Harris is a 61 y.o. female admitted on 06/03/2022 with cellulitis.  Pharmacy has been consulted for vancomycin and cefepime dosing.   Plan: Initiate cefepime 2 gram Q8H Initiate Vancomycin 2000 mg Q24H. Goal AUC 400-550 Estimated AUC 486/Cmin: 9.5 Scr 0.8, IBW, Vd 0.72    Height: 5\' 7"  (170.2 cm) Weight: 87.1 kg (192 lb) IBW/kg (Calculated) : 61.6  Temp (24hrs), Avg:98.3 F (36.8 C), Min:98.3 F (36.8 C), Max:98.3 F (36.8 C)  Recent Labs  Lab 06/03/22 2019  WBC 10.1  CREATININE 0.77  LATICACIDVEN 1.1    Estimated Creatinine Clearance: 85.8 mL/min (by C-G formula based on SCr of 0.77 mg/dL).    No Known Allergies  Antimicrobials this admission: 10/3 metronidazole x 1  10/3 Vancomycin >>  10/3 cefepime >>  Dose adjustments this admission:   Microbiology results: 10/3 BCx: sent   Thank you for allowing pharmacy to be a part of this patient's care.  Dorothe Pea, PharmD, BCPS Clinical Pharmacist   06/03/2022 9:53 PM

## 2022-06-03 NOTE — ED Provider Notes (Signed)
[facility] Provider Note    Event Date/Time   First MD Initiated Contact with Patient 06/03/22 1955     (approximate)   History   Chief Complaint: Wound Infection   HPI  Kristina Harris is a 61 y.o. female with a past history of hypertension and diabetes who is sent to the ED from [place] [facility] primary care due to wound on the left great toe and concern for infection.  PCP note accompanying the patient also notes poorly controlled diabetes with a hemoglobin A1c of 10.  Patient denies fever chills chest pain or shortness of breath.  She does endorse some aching in the left toe and foot.  This has been present for about a week with gradual onset.     Physical Exam   Triage Vital Signs: ED Triage Vitals  Enc Vitals Group     BP 06/03/22 1959 (!) 211/92     Pulse Rate 06/03/22 1959 88     Resp 06/03/22 1959 19     Temp 06/03/22 1959 98.3 F (36.8 C)     Temp Source 06/03/22 1959 Oral     SpO2 06/03/22 1959 96 %     Weight 06/03/22 2010 192 lb (87.1 kg)     Height 06/03/22 2010 5\' 7"  (1.702 m)     Head Circumference --      Peak Flow --      Pain Score --      Pain Loc --      Pain Edu? --      Excl. in [place]? --     Most recent vital signs: Vitals:   06/03/22 1959  BP: (!) 211/92  Pulse: 88  Resp: 19  Temp: 98.3 F (36.8 C)  SpO2: 96%    General: Awake, no distress.  CV:  Good peripheral perfusion.  Regular rate and rhythm.  Symmetric dorsalis pedis pulse.  Extremities are warm. Resp:  Normal effort.  Abd:  No distention.  Other:  Left great toe skin is denuded and macerated with exudate.  There is edema diffusely of the foot with erythema extending to the mid calf.  Pain has mild tenderness along this course.  No crepitus.  No other open wounds.  No deep space soft tissue defect.   ED Results / Procedures / Treatments   Labs (all labs ordered are listed, but only abnormal results are displayed) Labs Reviewed  CBC WITH  DIFFERENTIAL/PLATELET - Abnormal; Notable for the following components:      Result Value   Hemoglobin 11.4 (*)    HCT 35.8 (*)    MCV 79.2 (*)    MCH 25.2 (*)    Platelets 429 (*)    Abs Immature Granulocytes 0.10 (*)    All other components within normal limits  CULTURE, BLOOD (ROUTINE X 2)  CULTURE, BLOOD (ROUTINE X 2)  APTT  PROTIME-INR  LACTIC ACID, PLASMA  LACTIC ACID, PLASMA  COMPREHENSIVE METABOLIC PANEL  URINALYSIS, COMPLETE (UACMP) WITH MICROSCOPIC     EKG    RADIOLOGY X-ray left foot interpreted by me, negative for evidence of osteomyelitis or fracture.  Radiology report reviewed  Ultrasound left lower extremity pending   PROCEDURES:  .Critical Care  Performed by: Carrie Mew, MD Authorized by: Carrie Mew, MD   Critical care provider statement:    Critical care time (minutes):  33   Critical care time was exclusive of:  Separately billable procedures and treating other patients   Critical care  was necessary to treat or prevent imminent or life-threatening deterioration of the following conditions:  Endocrine crisis and circulatory failure   Critical care was time spent personally by me on the following activities:  Development of treatment plan with patient or surrogate, discussions with consultants, evaluation of patient's response to treatment, examination of patient, obtaining history from patient or surrogate, ordering and performing treatments and interventions, ordering and review of laboratory studies, ordering and review of radiographic studies, pulse oximetry, re-evaluation of patient's condition and review of old charts    MEDICATIONS ORDERED IN ED: Medications  ceFEPIme (MAXIPIME) 2 g in sodium chloride 0.9 % 100 mL IVPB (has no administration in time range)  metroNIDAZOLE (FLAGYL) IVPB 500 mg (has no administration in time range)  vancomycin (VANCOCIN) IVPB 1000 mg/200 mL premix (has no administration in time range)  amLODipine  (NORVASC) tablet 5 mg (has no administration in time range)  hydrALAZINE (APRESOLINE) injection 10 mg (has no administration in time range)     IMPRESSION / MDM / [place] / ED COURSE  I reviewed the triage vital signs and the nursing notes.                              Differential diagnosis includes, but is not limited to, diabetic foot infection, osteomyelitis, foot fracture, AKI, electrolyte abnormality  Patient's presentation is most consistent with acute presentation with potential threat to life or bodily function.  Patient presents with uncontrolled diabetes, uncontrolled hypertension, clinically apparent left foot infection with macerated appearance, will need broad-spectrum antibiotic coverage including gram-positive's and Pseudomonas.  Will start cefepime vancomycin and Flagyl.  Initial x-ray does not show any obvious signs of osteomyelitis.  We will follow-up labs and plan to admit for further management.  Initial blood pressure is 211/90.  Patient given IV hydralazine and a dose of her oral amlodipine that she takes at home for initial control.       FINAL CLINICAL IMPRESSION(S) / ED DIAGNOSES   Final diagnoses:  Cellulitis of left lower extremity  Type 2 diabetes mellitus without complication, unspecified whether long term insulin use ([place])  Uncontrolled hypertension     Rx / DC Orders   ED Discharge Orders     None        Note:  This document was prepared using Dragon voice recognition software and may include unintentional dictation errors.   Carrie Mew, MD 06/03/22 2122

## 2022-06-03 NOTE — Assessment & Plan Note (Addendum)
  -   Vancomycin and cefepime ordered - Blood cultures x2 have been collected and are in process - Podiatry consultation, Dr. Amalia Hailey via staff message in Epic order - N.p.o. after midnight except for sips with meds

## 2022-06-03 NOTE — Assessment & Plan Note (Signed)
-   Insulin SSI with at bedtime coverage ordered ?- Goal inpatient blood glucose levels 140-180 ?

## 2022-06-03 NOTE — Assessment & Plan Note (Signed)
-   Patient takes atorvastatin 10 mg daily 

## 2022-06-03 NOTE — ED Triage Notes (Signed)
Pt sent by PCP due to open wound to the left great toe. Pt with reports of blister x1 week that busted last Thursday and today pt has swelling, discharge and open wound to the bottom of left great toe. Pt has DM and hypertension. Per pt, she has not taken her medications today. Pt also sts she wears steel toe boots regularly. Pt denies fever.

## 2022-06-03 NOTE — H&P (Signed)
History and Physical   Kristina Harris ZJI:[id number] DOB: 08-Jan-1961 DOA: 06/03/2022  PCP: Marguerita Merles, MD  Patient coming from: Home  I have personally briefly reviewed patient's old medical records in [place].  Chief Concern: Foot infection  HPI: Ms. Kristina Harris is a 61 year old female with history of poorly controlled noninsulin-dependent diabetes mellitus, hyperlipidemia, hypertension, who presents emergency department for chief concerns of left first toe open wound infection.  Initial vitals in the emergency department showed temperature 98.3, respiration rate of 19, heart rate of 88, blood pressure 211 over 92 and improved to 192/91, SPO2 of 96% on room air.  Serum sodium is 137, potassium 3.6, chloride 101, bicarb 26, BUN of 9, serum creatinine of 0.77, nonfasting blood glucose 152, GFR greater than 60, WBC 10.1, hemoglobin 11.4, platelets of 429.  lactic acid 1.1  ED treatment: Amlodipine 5 mg, hydralazine 10 mg IV, cefepime, vancomycin, metronidazole. ------------------------------ At bedside patient was able to tell me her name, her age, the current calendar year.  She reports that she developed a blister on Thursday, 05/29/2022.  She reports the blister burst did not and yesterday she noticed worsening foot wound and malodor with purulence.  She denies fever, chills, shortness of breath, chest pain, nausea, vomiting.  Social history: Patient is at home with her daughter and 2 grand daughters.  She denies tobacco, EtOH, recreational drug use.  She is currently working in [employer].  ROS: Constitutional: no weight change, no fever ENT/Mouth: no sore throat, no rhinorrhea Eyes: no eye pain, no vision changes Cardiovascular: no chest pain, no dyspnea,  no edema, no palpitations Respiratory: no cough, no sputum, no wheezing Gastrointestinal: no nausea, no vomiting, no diarrhea, no constipation Genitourinary: no urinary  incontinence, no dysuria, no hematuria Musculoskeletal: no arthralgias, no myalgias Skin: + skin lesions, + drainage Neuro: no weakness, no loss of consciousness, no syncope Psych: no anxiety, no depression, no decrease appetite Heme/Lymph: no bruising, no bleeding  ED Course: Discussed with emergency medicine provider, patient requiring hospitalization for chief concerns of diabetic foot infection but this worsening.  Assessment/Plan  Principal Problem:   Diabetic foot infection ([place]) Active Problems:   Primary hypertension   Hyperlipidemia   Diabetes mellitus type 2, noninsulin dependent ([place])   Assessment and Plan:  * Diabetic foot infection ([place])    - Vancomycin and cefepime ordered - Blood cultures x2 have been collected and are in process - Podiatry consultation, Dr. Amalia Hailey via staff message in Epic order - N.p.o. after midnight except for sips with meds  Diabetes mellitus type 2, noninsulin dependent ([place]) - Insulin SSI with at bedtime coverage ordered - Goal inpatient blood glucose levels 140-180  Hyperlipidemia - Patient takes atorvastatin 10 mg daily  Primary hypertension - Resumed home losartan 50 mg daily - Labetalol 5 mg IV every 3 hours as needed for SBP greater than 185, 3 doses ordered - Hydralazine 5 mg IV every 8 hours as needed for SBP greater than 180, 3 days ordered starting at 600 on 06/04/2022  Chart reviewed.   DVT prophylaxis: Heparin 5000 units subcutaneous every 8 hours Code Status: DNR Diet: Heart healthy/carb modified; n.p.o. after midnight Family Communication: A phone call was offered, patient states her daughter already knows she is in the hospital Disposition Plan: Pending clinical course Consults called: Podiatry Admission status: MedSurg, observation  Past Medical History:  Diagnosis Date   Diabetes mellitus without complication ([place])    Hypertension    Past Surgical History:  Procedure Laterality Date   BREAST BIOPSY Right  2015   Social History:  reports that she has quit smoking. She has never used smokeless tobacco. She reports that she does not currently use alcohol. She reports that she does not use drugs.  No Known Allergies Family History  Problem Relation Age of Onset   Hypertension Mother    Diabetes Mother    Family history: Family history reviewed and not pertinent.  Prior to Admission medications   Medication Sig Start Date End Date Taking? Authorizing Provider  Albiglutide 30 MG PEN Inject into the skin. 12/14/14   [provider]  albuterol (PROVENTIL HFA;VENTOLIN HFA) 108 (90 Base) MCG/ACT inhaler  05/23/18   [provider]  amLODipine (NORVASC) 5 MG tablet  03/31/18   [provider]  atorvastatin (LIPITOR) 10 MG tablet Take by mouth. 08/07/17 08/07/18  [provider]  benzonatate (TESSALON) 200 MG capsule  05/23/18   [provider]  Dextromethorphan-guaiFENesin (MUCINEX DM) 30-600 MG TB12 Take 1 tablet by mouth every 12 (twelve) hours. 05/23/18   [provider]  fluticasone Aleda Grana) 50 MCG/ACT nasal spray  05/23/18   [provider]  glipiZIDE (GLUCOTROL) 5 MG tablet 1tab With Bkft. & 2tabs with supper 06/26/17   [provider]  TRUEPLUS PEN NEEDLES 31G X 5 MM MISC  03/23/18   [provider]   Physical Exam: Vitals:   06/03/22 1959 06/03/22 2010 06/03/22 2126  BP: (!) 211/92  (!) 192/91  Pulse: 88  79  Resp: 19  18  Temp: 98.3 F (36.8 C)    TempSrc: Oral    SpO2: 96%  97%  Weight:  87.1 kg   Height:  5\' 7"  (1.702 m)    Constitutional: appears age appropriate, NAD, calm, comfortable Eyes: PERRL, lids and conjunctivae normal ENMT: Mucous membranes are moist. Posterior pharynx clear of any exudate or lesions. Age-appropriate dentition. Hearing appropriate Neck: normal, supple, no masses, no thyromegaly Respiratory: clear to auscultation bilaterally, no wheezing, no crackles. Normal respiratory effort.  No accessory muscle use.  Cardiovascular: Regular rate and rhythm, no murmurs / rubs / gallops. No extremity edema. 2+ pedal pulses. No carotid bruits.  Abdomen: no tenderness, no masses palpated, no hepatosplenomegaly. Bowel sounds positive.  Musculoskeletal: no clubbing / cyanosis. No joint deformity upper and lower extremities. Good ROM, no contractures, no atrophy. Normal muscle tone.  Skin: + lesions, ulcers.    Neurologic: Sensation intact. Strength 5/5 in all 4.  Psychiatric: Normal judgment and insight. Alert and oriented x 3. Normal mood.   EKG: Not indicated at this time    X-ray on Admission: I personally reviewed and I agree with radiologist reading as below.  Venous Img Lower Unilateral Left  Result Date: 06/03/2022 CLINICAL DATA:  Left calf swelling/pain EXAM: LEFT LOWER EXTREMITY VENOUS DOPPLER ULTRASOUND TECHNIQUE: Gray-scale sonography with compression, as well as color and duplex ultrasound, were performed to evaluate the deep venous system(s) from the level of the common femoral vein through the popliteal and proximal calf veins. COMPARISON:  None Available. FINDINGS: VENOUS Normal compressibility of the common femoral, superficial femoral, and popliteal veins, as well as the visualized calf veins. Visualized portions of profunda femoral vein and great saphenous vein unremarkable. No filling defects to suggest DVT on grayscale or color Doppler imaging. Doppler waveforms show normal direction of venous flow, normal respiratory plasticity and response to augmentation. Limited views of the contralateral common femoral vein are unremarkable. OTHER None. Limitations: none IMPRESSION: Negative.  Electronically Signed   By: Charline Bills M.D.   On: 06/03/2022 21:31   DG Foot Complete Left  Result Date: 06/03/2022 CLINICAL DATA:  Left foot pain and swelling, soft tissue wound initial encounter EXAM: LEFT FOOT - COMPLETE 3+ VIEW COMPARISON:  None Available. FINDINGS: Distal soft  tissue swelling is noted in the foot. Soft tissue wound is noted along the medial aspect of the first toe. No discrete erosive changes to suggest osteomyelitis are noted. Other focal abnormality is seen. IMPRESSION: Soft tissue swelling and wound along the first toe. No acute bony abnormality to correspond with osteomyelitis is seen. Electronically Signed   By: Alcide Clever M.D.   On: 06/03/2022 20:31    Labs on Admission: I have personally reviewed following labs  CBC: Recent Labs  Lab 06/03/22 2019  WBC 10.1  NEUTROABS 6.3  HGB 11.4*  HCT 35.8*  MCV 79.2*  PLT 429*   Basic Metabolic Panel: Recent Labs  Lab 06/03/22 2019  NA 137  K 3.6  CL 101  CO2 26  GLUCOSE 152*  BUN 9  CREATININE 0.77  CALCIUM 9.3   GFR: Estimated Creatinine Clearance: 85.8 mL/min (by C-G formula based on SCr of 0.77 mg/dL).  Liver Function Tests: Recent Labs  Lab 06/03/22 2019  AST 16  ALT 18  ALKPHOS 83  BILITOT 0.5  PROT 8.3*  ALBUMIN 3.8   Coagulation Profile: Recent Labs  Lab 06/03/22 2019  INR 1.1   Dr. Sedalia Muta [facility]  If 7PM-7AM, please contact overnight-coverage provider If 7AM-7PM, please contact day coverage provider [email or web address]  06/03/2022, 11:04 PM

## 2022-06-04 ENCOUNTER — Other Ambulatory Visit: Payer: Self-pay

## 2022-06-04 ENCOUNTER — Inpatient Hospital Stay: Payer: BC Managed Care – PPO

## 2022-06-04 DIAGNOSIS — L97529 Non-pressure chronic ulcer of other part of left foot with unspecified severity: Secondary | ICD-10-CM | POA: Diagnosis present

## 2022-06-04 DIAGNOSIS — K08109 Complete loss of teeth, unspecified cause, unspecified class: Secondary | ICD-10-CM | POA: Diagnosis present

## 2022-06-04 DIAGNOSIS — E1165 Type 2 diabetes mellitus with hyperglycemia: Secondary | ICD-10-CM | POA: Diagnosis present

## 2022-06-04 DIAGNOSIS — I10 Essential (primary) hypertension: Secondary | ICD-10-CM | POA: Diagnosis present

## 2022-06-04 DIAGNOSIS — E11628 Type 2 diabetes mellitus with other skin complications: Secondary | ICD-10-CM | POA: Diagnosis present

## 2022-06-04 DIAGNOSIS — E785 Hyperlipidemia, unspecified: Secondary | ICD-10-CM | POA: Diagnosis present

## 2022-06-04 DIAGNOSIS — E669 Obesity, unspecified: Secondary | ICD-10-CM | POA: Diagnosis present

## 2022-06-04 DIAGNOSIS — Z8249 Family history of ischemic heart disease and other diseases of the circulatory system: Secondary | ICD-10-CM | POA: Diagnosis not present

## 2022-06-04 DIAGNOSIS — E118 Type 2 diabetes mellitus with unspecified complications: Secondary | ICD-10-CM | POA: Diagnosis present

## 2022-06-04 DIAGNOSIS — Z7984 Long term (current) use of oral hypoglycemic drugs: Secondary | ICD-10-CM | POA: Diagnosis not present

## 2022-06-04 DIAGNOSIS — E1169 Type 2 diabetes mellitus with other specified complication: Secondary | ICD-10-CM | POA: Diagnosis present

## 2022-06-04 DIAGNOSIS — B3749 Other urogenital candidiasis: Secondary | ICD-10-CM | POA: Diagnosis present

## 2022-06-04 DIAGNOSIS — Z66 Do not resuscitate: Secondary | ICD-10-CM | POA: Diagnosis present

## 2022-06-04 DIAGNOSIS — M86172 Other acute osteomyelitis, left ankle and foot: Secondary | ICD-10-CM | POA: Diagnosis present

## 2022-06-04 DIAGNOSIS — L84 Corns and callosities: Secondary | ICD-10-CM | POA: Diagnosis present

## 2022-06-04 DIAGNOSIS — L089 Local infection of the skin and subcutaneous tissue, unspecified: Secondary | ICD-10-CM | POA: Diagnosis not present

## 2022-06-04 DIAGNOSIS — L03116 Cellulitis of left lower limb: Principal | ICD-10-CM

## 2022-06-04 DIAGNOSIS — S90822A Blister (nonthermal), left foot, initial encounter: Secondary | ICD-10-CM | POA: Diagnosis present

## 2022-06-04 DIAGNOSIS — Z833 Family history of diabetes mellitus: Secondary | ICD-10-CM | POA: Diagnosis not present

## 2022-06-04 DIAGNOSIS — Z79899 Other long term (current) drug therapy: Secondary | ICD-10-CM | POA: Diagnosis not present

## 2022-06-04 DIAGNOSIS — L03032 Cellulitis of left toe: Secondary | ICD-10-CM | POA: Diagnosis present

## 2022-06-04 DIAGNOSIS — Z6830 Body mass index (BMI) 30.0-30.9, adult: Secondary | ICD-10-CM | POA: Diagnosis not present

## 2022-06-04 DIAGNOSIS — E11621 Type 2 diabetes mellitus with foot ulcer: Secondary | ICD-10-CM | POA: Diagnosis present

## 2022-06-04 DIAGNOSIS — Z87891 Personal history of nicotine dependence: Secondary | ICD-10-CM | POA: Diagnosis not present

## 2022-06-04 LAB — URINALYSIS, COMPLETE (UACMP) WITH MICROSCOPIC
Bacteria, UA: NONE SEEN
Bilirubin Urine: NEGATIVE
Glucose, UA: >=500 mg/dL — AB
Hgb urine dipstick: NEGATIVE
Ketones, ur: NEGATIVE mg/dL
Nitrite: NEGATIVE
Protein, ur: NEGATIVE mg/dL
Specific Gravity, Urine: 1.023 (ref 1.005–1.030)
pH: 5 (ref 5.0–8.0)

## 2022-06-04 LAB — BASIC METABOLIC PANEL
Anion gap: 4 — ABNORMAL LOW (ref 5–15)
BUN: 8 mg/dL (ref 6–20)
CO2: 25 mmol/L (ref 22–32)
Calcium: 8.8 mg/dL — ABNORMAL LOW (ref 8.9–10.3)
Chloride: 109 mmol/L (ref 98–111)
Creatinine, Ser: 0.68 mg/dL (ref 0.44–1.00)
GFR, Estimated: >60 mL/min (ref >60)
Glucose, Bld: 207 mg/dL — ABNORMAL HIGH (ref 70–99)
Potassium: 3.9 mmol/L (ref 3.5–5.1)
Sodium: 138 mmol/L (ref 135–145)

## 2022-06-04 LAB — CBC
HCT: 35.9 % — ABNORMAL LOW (ref 36.0–46.0)
Hemoglobin: 11.3 g/dL — ABNORMAL LOW (ref 12.0–15.0)
MCH: 25.2 pg — ABNORMAL LOW (ref 26.0–34.0)
MCHC: 31.5 g/dL (ref 30.0–36.0)
MCV: 80 fL (ref 80.0–100.0)
Platelets: 402 10*3/uL — ABNORMAL HIGH (ref 150–400)
RBC: 4.49 MIL/uL (ref 3.87–5.11)
RDW: 15.2 % (ref 11.5–15.5)
WBC: 8.3 10*3/uL (ref 4.0–10.5)
nRBC: 0 % (ref 0.0–0.2)

## 2022-06-04 LAB — GLUCOSE, CAPILLARY
Glucose-Capillary: 114 mg/dL — ABNORMAL HIGH (ref 70–99)
Glucose-Capillary: 180 mg/dL — ABNORMAL HIGH (ref 70–99)

## 2022-06-04 LAB — LACTIC ACID, PLASMA: Lactic Acid, Venous: 0.7 mmol/L (ref 0.5–1.9)

## 2022-06-04 LAB — HIV ANTIBODY (ROUTINE TESTING W REFLEX): HIV Screen 4th Generation wRfx: NONREACTIVE

## 2022-06-04 LAB — SEDIMENTATION RATE: Sed Rate: 84 mm/hr — ABNORMAL HIGH (ref 0–30)

## 2022-06-04 LAB — HEMOGLOBIN A1C
Hgb A1c MFr Bld: 10 % — ABNORMAL HIGH (ref 4.8–5.6)
Mean Plasma Glucose: 240.3 mg/dL

## 2022-06-04 LAB — CBG MONITORING, ED
Glucose-Capillary: 168 mg/dL — ABNORMAL HIGH (ref 70–99)
Glucose-Capillary: 187 mg/dL — ABNORMAL HIGH (ref 70–99)

## 2022-06-04 MED ORDER — LIVING WELL WITH DIABETES BOOK
Freq: Once | Status: AC
  Filled 2022-06-04: qty 1

## 2022-06-04 MED ORDER — VANCOMYCIN HCL 1500 MG/300ML IV SOLN
1500.0000 mg | INTRAVENOUS | Status: DC
  Administered 2022-06-04 – 2022-06-06 (×3): 1500 mg via INTRAVENOUS
  Filled 2022-06-04 (×3): qty 300

## 2022-06-04 MED ORDER — INSULIN GLARGINE-YFGN 100 UNIT/ML ~~LOC~~ SOLN
10.0000 [IU] | Freq: Every day | SUBCUTANEOUS | Status: DC
  Administered 2022-06-04 – 2022-06-05 (×2): 10 [IU] via SUBCUTANEOUS
  Filled 2022-06-04 (×2): qty 0.1

## 2022-06-04 NOTE — ED Notes (Signed)
Informed RN bed assigned 

## 2022-06-04 NOTE — Inpatient Diabetes Management (Signed)
Inpatient Diabetes Program Recommendations  AACE/ADA: New Consensus Statement on Inpatient Glycemic Control (2015)  Target Ranges:  Prepandial:   less than 140 mg/dL      Peak postprandial:   less than 180 mg/dL (1-2 hours)      Critically ill patients:  140 - 180 mg/dL   Lab Results  Component Value Date   GLUCAP 187 (H) 06/04/2022   HGBA1C 10.0 (H) 06/04/2022    Review of Glycemic Control  Diabetes history: DM2 Outpatient Diabetes medications: Jardiance 10 mg qd, Victoza 1.2 mg qd Current orders for Inpatient glycemic control: Novolog 0-15 units tid, 0-5 units hs  Inpatient Diabetes Program Recommendations:   Spoke with pt via phone (DM coordinator working remotely from [employer]) about A1C results 10.0 (average blood glucose 240 over the past 2-3 months time)  and explained what an A1C is, basic pathophysiology of DM Type 2, basic home care, basic diabetes diet nutrition principles, importance of checking CBGs and maintaining good CBG control to prevent long-term and short-term complications. Reviewed signs and symptoms of hyperglycemia and hypoglycemia and how to treat hypoglycemia at home. Also reviewed blood sugar goals at home.  RNs to provide ongoing basic DM education at bedside with this patient. Have ordered educational booklet living well with diabetes. Patient worked 18 days straight recently that requires steel toed shoes and on her feet all day. Patient has glucose meter and strips @ home but states CBGs have been running >200 @ home. 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 Patient has been drinking sugar in her coffee (1/2 tsp each day) and a regular soda each day, but willing to transition to sugar free and limit carbohydrates.  Will follow during hospitalization.  Thank you, Nani Gasser. Prima Rayner, RN, MSN, CDE  Diabetes Coordinator Inpatient Glycemic Control Team Team Pager [phone]  (8am-5pm) 06/04/2022 1:32 PM

## 2022-06-04 NOTE — Progress Notes (Signed)
[facility] [place] NAME: Kristina Harris    MR#:  [id number]  DATE OF BIRTH:  August 01, 1961  SUBJECTIVE:  no family at bedside. Patient came in with blister on the left foot which burst and started having significant discharge.    VITALS:  Blood pressure (!) 162/79, pulse 84, temperature 98.1 F (36.7 C), resp. rate 20, height 5\' 7"  (1.702 m), weight 87.1 kg, SpO2 98 %.  PHYSICAL EXAMINATION:   GENERAL:  61 y.o.-year-old patient lying in the bed with no acute distress.  LUNGS: Normal breath sounds bilaterally, no wheezing, rales, rhonchi.  CARDIOVASCULAR: S1, S2 normal. No murmurs, rubs, or gallops.  ABDOMEN: Soft, nontender, nondistended. Bowel sounds present.  EXTREMITIES:   NEUROLOGIC: nonfocal  patient is alert and awake SKIN: No obvious rash, lesion, or ulcer.   LABORATORY PANEL:  CBC Recent Labs  Lab 06/04/22 0552  WBC 8.3  HGB 11.3*  HCT 35.9*  PLT 402*    Chemistries  Recent Labs  Lab 06/03/22 2019 06/04/22 0552  NA 137 138  K 3.6 3.9  CL 101 109  CO2 26 25  GLUCOSE 152* 207*  BUN 9 8  CREATININE 0.77 0.68  CALCIUM 9.3 8.8*  AST 16  --   ALT 18  --   ALKPHOS 83  --   BILITOT 0.5  --    Cardiac Enzymes No results for input(s): "TROPONINI" in the last 168 hours. RADIOLOGY:  US Venous Img Lower Unilateral Left  Result Date: 06/03/2022 CLINICAL DATA:  Left calf swelling/pain EXAM: LEFT LOWER EXTREMITY VENOUS DOPPLER ULTRASOUND TECHNIQUE: Gray-scale sonography with compression, as well as color and duplex ultrasound, were performed to evaluate the deep venous system(s) from the level of the common femoral vein through the popliteal and proximal calf veins. COMPARISON:  None Available. FINDINGS: VENOUS Normal compressibility of the common femoral, superficial femoral, and popliteal veins, as well as the visualized calf veins. Visualized portions of profunda femoral vein and great saphenous vein unremarkable.  No filling defects to suggest DVT on grayscale or color Doppler imaging. Doppler waveforms show normal direction of venous flow, normal respiratory plasticity and response to augmentation. Limited views of the contralateral common femoral vein are unremarkable. OTHER None. Limitations: none IMPRESSION: Negative. Electronically Signed   By: Julian Hy M.D.   On: 06/03/2022 21:31   DG Foot Complete Left  Result Date: 06/03/2022 CLINICAL DATA:  Left foot pain and swelling, soft tissue wound initial encounter EXAM: LEFT FOOT - COMPLETE 3+ VIEW COMPARISON:  None Available. FINDINGS: Distal soft tissue swelling is noted in the foot. Soft tissue wound is noted along the medial aspect of the first toe. No discrete erosive changes to suggest osteomyelitis are noted. Other focal abnormality is seen. IMPRESSION: Soft tissue swelling and wound along the first toe. No acute bony abnormality to correspond with osteomyelitis is seen. Electronically Signed   By: Inez Catalina M.D.   On: 06/03/2022 20:31    Assessment and Plan Kristina Harris is a 61 year old female with history of poorly controlled noninsulin-dependent diabetes mellitus, hyperlipidemia, hypertension, who presents emergency department for chief concerns of left first toe open wound infection.  Left great toe diabetic infection - Vancomycin and cefepime ordered - Blood cultures x2 neg - Podiatry consultation, Dr. Sherryle Lis-- MRI of the left foot, NPO after midnight for possible surgery tomorrow   Diabetes mellitus type 2, noninsulin dependent ([place]) - Insulin SSI with at bedtime coverage ordered - Goal inpatient blood  glucose levels 140-180 -- seen by diabetes coordinator -able see pending -- start Semglee 10 units daily   Hyperlipidemia - Patient takes atorvastatin   Primary hypertension - Resumed home losartan   DVT prophylaxis: Heparin 5000 units subcutaneous every 8 hours Code Status: DNR Diet: Heart healthy/carb modified;  n.p.o. after midnight Consults :Podiatry Level of care: Med-Surg Status is: Inpatient-- left diabetic foot infection    TOTAL TIME TAKING CARE OF THIS PATIENT: 35 minutes.  >50% time spent on counselling and coordination of care  Note: This dictation was prepared with Dragon dictation along with smaller phrase technology. Any transcriptional errors that result from this process are unintentional.  Enedina Finner M.D    [facility]   CC: Primary care physician; Leanna Sato, MD

## 2022-06-04 NOTE — Consult Note (Signed)
Pharmacy Antibiotic Note  Kristina Harris is a 61 y.o. female admitted on 06/03/2022 with cellulitis/DFI. Pharmacy has been consulted for vancomycin and cefepime dosing.   Plan: Continue cefepime 2 grams every 8 hours Reduce vancomycin 1500 mg every 24 hours Goal AUC 400-550 Estimated AUC 486/Cmin: 9.5 Scr 0.8, IBW, Vd 0.5  Follow up renal function, clinical course.  Height: 5\' 7"  (170.2 cm) Weight: 87.1 kg (192 lb) IBW/kg (Calculated) : 61.6  Temp (24hrs), Avg:98.5 F (36.9 C), Min:98.3 F (36.8 C), Max:98.7 F (37.1 C)  Recent Labs  Lab 06/03/22 2019 06/04/22 0552  WBC 10.1 8.3  CREATININE 0.77 0.68  LATICACIDVEN 1.1 0.7     Estimated Creatinine Clearance: 85.8 mL/min (by C-G formula based on SCr of 0.68 mg/dL).    No Known Allergies  Antimicrobials this admission: 10/3 metronidazole x 1  10/3 Vancomycin >>  10/3 cefepime >>  Dose adjustments this admission:   Microbiology results: 10/3 BCx: NG < 12 hours   Thank you for allowing pharmacy to be a part of this patient's care.   Glean Salvo, PharmD Clinical Pharmacist  06/04/2022 1:40 PM

## 2022-06-04 NOTE — Consult Note (Addendum)
Reason for Consult: Diabetic foot infection left great toe cellulitis Referring Physician: Dr. Ernestina Penna Kristina Harris is an 61 y.o. female.  HPI: She has a history of type 2 diabetes.  She developed significant calluses on both great toes.  The left one became quite severe recently and began to swell.  This worsened over the weekend.  She does not know her last A1c  Past Medical History:  Diagnosis Date   Diabetes mellitus without complication (HCC)    Hypertension     Past Surgical History:  Procedure Laterality Date   BREAST BIOPSY Right 2015    Family History  Problem Relation Age of Onset   Hypertension Mother    Diabetes Mother     Social History:  reports that she has quit smoking. She has never used smokeless tobacco. She reports that she does not currently use alcohol. She reports that she does not use drugs.  Allergies: No Known Allergies  Medications: I have reviewed the patient's current medications.  Results for orders placed or performed during the hospital encounter of 06/03/22 (from the past 48 hour(s))  Blood Culture (routine x 2)     Status: None (Preliminary result)   Collection Time: 06/03/22  7:59 PM   Specimen: BLOOD  Result Value Ref Range   Specimen Description BLOOD RIGHT ANTECUBITAL    Special Requests      BOTTLES DRAWN AEROBIC AND ANAEROBIC Blood Culture adequate volume   Culture      NO GROWTH < 12 HOURS Performed at [facility], [address], [place], [place] [address]    Report Status PENDING   Blood Culture (routine x 2)     Status: None (Preliminary result)   Collection Time: 06/03/22  8:00 PM   Specimen: BLOOD  Result Value Ref Range   Specimen Description BLOOD BLOOD RIGHT FOREARM    Special Requests      BOTTLES DRAWN AEROBIC AND ANAEROBIC Blood Culture adequate volume   Culture      NO GROWTH < 12 HOURS Performed at [facility], [address] [place], [place] [address]    Report  Status PENDING   Lactic acid, plasma     Status: None   Collection Time: 06/03/22  8:19 PM  Result Value Ref Range   Lactic Acid, Venous 1.1 0.5 - 1.9 mmol/L    Comment: Performed at [facility], [address] [place], [place] [address]  Comprehensive metabolic panel     Status: Abnormal   Collection Time: 06/03/22  8:19 PM  Result Value Ref Range   Sodium 137 135 - 145 mmol/L   Potassium 3.6 3.5 - 5.1 mmol/L   Chloride 101 98 - 111 mmol/L   CO2 26 22 - 32 mmol/L   Glucose, Bld 152 (H) 70 - 99 mg/dL    Comment: Glucose reference range applies only to samples taken after fasting for at least 8 hours.   BUN 9 6 - 20 mg/dL   Creatinine, Ser 0.31 0.44 - 1.00 mg/dL   Calcium 9.3 8.9 - 59.4 mg/dL   Total Protein 8.3 (H) 6.5 - 8.1 g/dL   Albumin 3.8 3.5 - 5.0 g/dL   AST 16 15 - 41 U/L   ALT 18 0 - 44 U/L   Alkaline Phosphatase 83 38 - 126 U/L   Total Bilirubin 0.5 0.3 - 1.2 mg/dL   GFR, Estimated >58 >59 mL/min    Comment: (NOTE) Calculated using the  CKD-EPI Creatinine Equation (2021)    Anion gap 10 5 - 15    Comment: Performed at [facility], [address] [place], [place] [address]  CBC with Differential     Status: Abnormal   Collection Time: 06/03/22  8:19 PM  Result Value Ref Range   WBC 10.1 4.0 - 10.5 K/uL   RBC 4.52 3.87 - 5.11 MIL/uL   Hemoglobin 11.4 (L) 12.0 - 15.0 g/dL   HCT 35.8 (L) 36.0 - 46.0 %   MCV 79.2 (L) 80.0 - 100.0 fL   MCH 25.2 (L) 26.0 - 34.0 pg   MCHC 31.8 30.0 - 36.0 g/dL   RDW 15.2 11.5 - 15.5 %   Platelets 429 (H) 150 - 400 K/uL   nRBC 0.0 0.0 - 0.2 %   Neutrophils Relative % 63 %   Neutro Abs 6.3 1.7 - 7.7 K/uL   Lymphocytes Relative 26 %   Lymphs Abs 2.7 0.7 - 4.0 K/uL   Monocytes Relative 8 %   Monocytes Absolute 0.8 0.1 - 1.0 K/uL   Eosinophils Relative 2 %   Eosinophils Absolute 0.2 0.0 - 0.5 K/uL   Basophils Relative 0 %   Basophils Absolute 0.0 0.0 - 0.1 K/uL   Immature Granulocytes 1 %   Abs Immature  Granulocytes 0.10 (H) 0.00 - 0.07 K/uL    Comment: Performed at [facility], [address] [place], [place] [address]  APTT     Status: None   Collection Time: 06/03/22  8:19 PM  Result Value Ref Range   aPTT 31 24 - 36 seconds    Comment: Performed at [facility], [address] [place], [place] [address]  Protime-INR     Status: None   Collection Time: 06/03/22  8:19 PM  Result Value Ref Range   Prothrombin Time 13.7 11.4 - 15.2 seconds   INR 1.1 0.8 - 1.2    Comment: (NOTE) INR goal varies based on device and disease states. Performed at [facility], [address] [place], [place] [address]   CBG monitoring, ED     Status: Abnormal   Collection Time: 06/03/22 11:04 PM  Result Value Ref Range   Glucose-Capillary 150 (H) 70 - 99 mg/dL    Comment: Glucose reference range applies only to samples taken after fasting for at least 8 hours.  Lactic acid, plasma     Status: None   Collection Time: 06/04/22  5:52 AM  Result Value Ref Range   Lactic Acid, Venous 0.7 0.5 - 1.9 mmol/L    Comment: Performed at [facility], [address] [place], [place] [address]  Urinalysis, Complete w Microscopic     Status: Abnormal   Collection Time: 06/04/22  5:52 AM  Result Value Ref Range   Color, Urine YELLOW (A) YELLOW   APPearance HAZY (A) CLEAR   Specific Gravity, Urine 1.023 1.005 - 1.030   pH 5.0 5.0 - 8.0   Glucose, UA >=500 (A) NEGATIVE mg/dL   Hgb urine dipstick NEGATIVE NEGATIVE   Bilirubin Urine NEGATIVE NEGATIVE   Ketones, ur NEGATIVE NEGATIVE mg/dL   Protein, ur NEGATIVE NEGATIVE mg/dL   Nitrite NEGATIVE NEGATIVE   Leukocytes,Ua TRACE (A) NEGATIVE   RBC / HPF 0-5 0 - 5 RBC/hpf   WBC, UA 21-50 0 - 5 WBC/hpf   Bacteria, UA NONE SEEN NONE SEEN   Squamous Epithelial / LPF 6-10 0 - 5   Mucus PRESENT     Comment:  Performed at [facility], [address] [place], [place] [id number]  Basic metabolic panel     Status:  Abnormal   Collection Time: 06/04/22  5:52 AM  Result Value Ref Range   Sodium 138 135 - 145 mmol/L   Potassium 3.9 3.5 - 5.1 mmol/L   Chloride 109 98 - 111 mmol/L   CO2 25 22 - 32 mmol/L   Glucose, Bld 207 (H) 70 - 99 mg/dL    Comment: Glucose reference range applies only to samples taken after fasting for at least 8 hours.   BUN 8 6 - 20 mg/dL   Creatinine, Ser 0.68 0.44 - 1.00 mg/dL   Calcium 8.8 (L) 8.9 - 10.3 mg/dL   GFR, Estimated >60 >60 mL/min    Comment: (NOTE) Calculated using the CKD-EPI Creatinine Equation (2021)    Anion gap 4 (L) 5 - 15    Comment: Performed at [facility], [address] [place], [place] [address]  CBC     Status: Abnormal   Collection Time: 06/04/22  5:52 AM  Result Value Ref Range   WBC 8.3 4.0 - 10.5 K/uL   RBC 4.49 3.87 - 5.11 MIL/uL   Hemoglobin 11.3 (L) 12.0 - 15.0 g/dL   HCT 35.9 (L) 36.0 - 46.0 %   MCV 80.0 80.0 - 100.0 fL   MCH 25.2 (L) 26.0 - 34.0 pg   MCHC 31.5 30.0 - 36.0 g/dL   RDW 15.2 11.5 - 15.5 %   Platelets 402 (H) 150 - 400 K/uL   nRBC 0.0 0.0 - 0.2 %    Comment: Performed at [facility], [address] [place], [place] [address]  Hemoglobin A1c     Status: Abnormal   Collection Time: 06/04/22  5:52 AM  Result Value Ref Range   Hgb A1c MFr Bld 10.0 (H) 4.8 - 5.6 %    Comment: (NOTE) Pre diabetes:          5.7%-6.4%  Diabetes:              >6.4%  Glycemic control for   <7.0% adults with diabetes    Mean Plasma Glucose 240.3 mg/dL    Comment: Performed at [facility], [place] [address], [place], [place] [address]  HIV Antibody (routine testing w rflx)     Status: None   Collection Time: 06/04/22  5:52 AM  Result Value Ref Range   HIV Screen 4th Generation wRfx Non Reactive Non Reactive    Comment: Performed at [facility], [place] [address] [place], [place] [address]  Sedimentation rate     Status: Abnormal   Collection Time: 06/04/22  6:53 AM  Result Value Ref Range   Sed Rate 84  (H) 0 - 30 mm/hr    Comment: Performed at [facility], [address] [place], [place] [address]  CBG monitoring, ED     Status: Abnormal   Collection Time: 06/04/22  9:41 AM  Result Value Ref Range   Glucose-Capillary 168 (H) 70 - 99 mg/dL    Comment: Glucose reference range applies only to samples taken after fasting for at least 8 hours.  CBG monitoring, ED     Status: Abnormal   Collection Time: 06/04/22 11:43 AM  Result Value Ref Range   Glucose-Capillary 187 (H) 70 - 99 mg/dL    Comment: Glucose reference range applies only to samples taken after fasting for at least 8 hours.    US Venous  Img Lower Unilateral Left  Result Date: 06/03/2022 CLINICAL DATA:  Left calf swelling/pain EXAM: LEFT LOWER EXTREMITY VENOUS DOPPLER ULTRASOUND TECHNIQUE: Gray-scale sonography with compression, as well as color and duplex ultrasound, were performed to evaluate the deep venous system(s) from the level of the common femoral vein through the popliteal and proximal calf veins. COMPARISON:  None Available. FINDINGS: VENOUS Normal compressibility of the common femoral, superficial femoral, and popliteal veins, as well as the visualized calf veins. Visualized portions of profunda femoral vein and great saphenous vein unremarkable. No filling defects to suggest DVT on grayscale or color Doppler imaging. Doppler waveforms show normal direction of venous flow, normal respiratory plasticity and response to augmentation. Limited views of the contralateral common femoral vein are unremarkable. OTHER None. Limitations: none IMPRESSION: Negative. Electronically Signed   By: Julian Hy M.D.   On: 06/03/2022 21:31   DG Foot Complete Left  Result Date: 06/03/2022 CLINICAL DATA:  Left foot pain and swelling, soft tissue wound initial encounter EXAM: LEFT FOOT - COMPLETE 3+ VIEW COMPARISON:  None Available. FINDINGS: Distal soft tissue swelling is noted in the foot. Soft tissue wound is noted along the  medial aspect of the first toe. No discrete erosive changes to suggest osteomyelitis are noted. Other focal abnormality is seen. IMPRESSION: Soft tissue swelling and wound along the first toe. No acute bony abnormality to correspond with osteomyelitis is seen. Electronically Signed   By: Inez Catalina M.D.   On: 06/03/2022 20:31    Review of Systems  Constitutional:  Positive for malaise/fatigue. Negative for chills and fever.  Cardiovascular:  Positive for leg swelling. Negative for chest pain and orthopnea.  Skin:        Ulcer drainage left great toe  All other systems reviewed and are negative.  Blood pressure (!) 162/79, pulse 84, temperature 98.1 F (36.7 C), resp. rate 20, height 5\' 7"  (1.702 m), weight 87.1 kg, SpO2 98 %.  Vitals:   06/04/22 1000 06/04/22 1206  BP: (!) 145/80 (!) 162/79  Pulse: 88 84  Resp: 20   Temp: 98.4 F (36.9 C) 98.1 F (36.7 C)  SpO2: 98% 98%    General AA&O x3. Normal mood and affect.  Vascular Dorsalis pedis and posterior tibial pulses  present 2+ left  Capillary refill normal to all digits. Pedal hair growth diminished.  Neurologic Epicritic sensation grossly present.  Dermatologic (Wound) Wound Location: Lt. foot left, hallux circumferential along plantar portion.  Significant epidermolysis.  Cellulitis to midfoot and swelling into leg.  Malodorous purulent drainage.  Probes deep to bone  Orthopedic: Motor intact BLE.    Assessment/Plan:  Left hallux osteomyelitis -Imaging: Studies independently reviewed.  With further bone and sed rate over 80 I recommend an MRI to evaluate for osteomyelitis -Antibiotics: Recommend maintaining on broad-spectrum antibiotics for diabetic foot infection -WB Status: WBAT  -Surgical Plan: I discussed with her possibility and likelihood of osteomyelitis.  Discussed surgical and antibiotic treatment of this.  If MRI is positive for osteomyelitis I likely would recommend amputation of the left great toe.  I discussed  this with her.  She understands the possibility and severity of this.  Tentatively will be n.p.o. past midnight tonight.  Will discuss with my partner Dr. Posey Pronto who is on-call tomorrow  Criselda Peaches 06/04/2022, 12:54 PM   Best available via secure chat for questions or concerns.

## 2022-06-05 ENCOUNTER — Encounter: Payer: Self-pay | Admitting: Internal Medicine

## 2022-06-05 DIAGNOSIS — L089 Local infection of the skin and subcutaneous tissue, unspecified: Secondary | ICD-10-CM | POA: Diagnosis not present

## 2022-06-05 DIAGNOSIS — E11628 Type 2 diabetes mellitus with other skin complications: Secondary | ICD-10-CM | POA: Diagnosis not present

## 2022-06-05 LAB — BASIC METABOLIC PANEL
Anion gap: 7 (ref 5–15)
BUN: 9 mg/dL (ref 6–20)
CO2: 24 mmol/L (ref 22–32)
Calcium: 8.4 mg/dL — ABNORMAL LOW (ref 8.9–10.3)
Chloride: 107 mmol/L (ref 98–111)
Creatinine, Ser: 0.57 mg/dL (ref 0.44–1.00)
GFR, Estimated: >60 mL/min (ref >60)
Glucose, Bld: 185 mg/dL — ABNORMAL HIGH (ref 70–99)
Potassium: 3.9 mmol/L (ref 3.5–5.1)
Sodium: 138 mmol/L (ref 135–145)

## 2022-06-05 LAB — GLUCOSE, CAPILLARY
Glucose-Capillary: 141 mg/dL — ABNORMAL HIGH (ref 70–99)
Glucose-Capillary: 109 mg/dL — ABNORMAL HIGH (ref 70–99)
Glucose-Capillary: 94 mg/dL (ref 70–99)
Glucose-Capillary: 208 mg/dL — ABNORMAL HIGH (ref 70–99)

## 2022-06-05 MED ORDER — SCOPOLAMINE 1 MG/3DAYS TD PT72
1.0000 | MEDICATED_PATCH | Freq: Once | TRANSDERMAL | Status: DC
  Administered 2022-06-05: 1.5 mg via TRANSDERMAL

## 2022-06-05 MED ORDER — AMLODIPINE BESYLATE 5 MG PO TABS
5.0000 mg | ORAL_TABLET | Freq: Every day | ORAL | Status: DC
  Administered 2022-06-05 – 2022-06-07 (×2): 5 mg via ORAL
  Filled 2022-06-05 (×2): qty 1

## 2022-06-05 MED ORDER — SCOPOLAMINE 1 MG/3DAYS TD PT72
MEDICATED_PATCH | TRANSDERMAL | Status: AC
  Filled 2022-06-05: qty 1

## 2022-06-05 MED ORDER — FENTANYL CITRATE (PF) 100 MCG/2ML IJ SOLN
INTRAMUSCULAR | Status: AC
  Filled 2022-06-05: qty 2

## 2022-06-05 MED ORDER — PROPOFOL 10 MG/ML IV BOLUS
INTRAVENOUS | Status: AC
  Filled 2022-06-05: qty 20

## 2022-06-05 MED ORDER — HYDRALAZINE HCL 20 MG/ML IJ SOLN: 5.0000 mg | Freq: Three times a day (TID) | INTRAMUSCULAR | Status: AC | PRN

## 2022-06-05 MED ORDER — INSULIN GLARGINE-YFGN 100 UNIT/ML ~~LOC~~ SOLN
15.0000 [IU] | Freq: Every day | SUBCUTANEOUS | Status: DC
  Administered 2022-06-06 – 2022-06-07 (×2): 15 [IU] via SUBCUTANEOUS
  Filled 2022-06-05 (×2): qty 0.15

## 2022-06-05 MED ORDER — MIDAZOLAM HCL 2 MG/2ML IJ SOLN
INTRAMUSCULAR | Status: AC
  Filled 2022-06-05: qty 2

## 2022-06-05 NOTE — Progress Notes (Signed)
[place] [facility] [place] NAME: Kristina Harris    MR#:  [id number]  DATE OF BIRTH:  1960/10/15  SUBJECTIVE:  no family at bedside. Patient came in with blister on the left foot which burst and started having significant discharge.  NPO for foot surgery   VITALS:  Blood pressure (!) 140/66, pulse 85, temperature 98.7 F (37.1 C), resp. rate 20, height 5\' 7"  (1.702 m), weight 87.1 kg, SpO2 96 %.  PHYSICAL EXAMINATION:   GENERAL:  61 y.o.-year-old patient lying in the bed with no acute distress.  LUNGS: Normal breath sounds bilaterally, no wheezing, rales, rhonchi.  CARDIOVASCULAR: S1, S2 normal. No murmurs  ABDOMEN: Soft, nontender, nondistended. Bowel sounds present.  EXTREMITIES:   NEUROLOGIC: nonfocal  patient is alert and awake SKIN: No obvious rash, lesion, or ulcer.   LABORATORY PANEL:  CBC Recent Labs  Lab 06/04/22 0552  WBC 8.3  HGB 11.3*  HCT 35.9*  PLT 402*     Chemistries  Recent Labs  Lab 06/03/22 2019 06/04/22 0552 06/05/22 0359  NA 137   < > 138  K 3.6   < > 3.9  CL 101   < > 107  CO2 26   < > 24  GLUCOSE 152*   < > 185*  BUN 9   < > 9  CREATININE 0.77   < > 0.57  CALCIUM 9.3   < > 8.4*  AST 16  --   --   ALT 18  --   --   ALKPHOS 83  --   --   BILITOT 0.5  --   --    < > = values in this interval not displayed.    Cardiac Enzymes No results for input(s): "TROPONINI" in the last 168 hours. RADIOLOGY:  MR FOOT LEFT WO CONTRAST  Result Date: 06/05/2022 CLINICAL DATA:  Diabetic with great toe wound and soft tissue swelling. Clinical concern for osteomyelitis. EXAM: MRI OF THE LEFT FOOT WITHOUT CONTRAST TECHNIQUE: Multiplanar, multisequence MR imaging of the left forefoot was performed. No intravenous contrast was administered. COMPARISON:  Radiographs 06/03/2022 FINDINGS: Bones/Joint/Cartilage Best seen on the recent radiographs, there is soft tissue swelling in the great toe with an open wound  medial to the distal phalanx with associated soft tissue emphysema. There is abnormal T2 hyperintensity throughout the distal phalanx without cortical destruction or abnormal signal on the T1 weighted images. There is no interphalangeal or metatarsal-phalangeal joint effusion. The proximal phalanx and 1st metatarsal appear unremarkable aside from mild degenerative changes. Mild midfoot degenerative changes without subluxation. Ligaments The Lisfranc ligament is intact. The collateral ligaments of the metatarsophalangeal joints are intact. Muscles and Tendons The forefoot muscles and tendons appear normal. No significant tenosynovitis. Soft tissues As above, soft tissue swelling in the great toe with an open wound medial to the distal phalanx. No focal fluid collection or foreign body identified in this region. There is susceptibility artifact superficially in the lateral plantar forefoot, corresponding with a tiny metallic foreign body on prior radiographs. No focal inflammation identified in this region. Moderate nonspecific dorsal subcutaneous edema. IMPRESSION: 1. Open wound medial to the distal phalanx of the great toe with associated soft tissue emphysema. No focal fluid collection or foreign body identified in this region. 2. Nonspecific T2 hyperintensity throughout the distal phalanx of the great toe without cortical destruction or abnormal T1 signal. Given proximity to the wound, this is suspicious for early osteomyelitis. 3. No evidence of  septic arthritis or tenosynovitis. 4. Nonspecific dorsal subcutaneous edema. Electronically Signed   By: Carey Bullocks M.D.   On: 06/05/2022 08:10   US Venous Img Lower Unilateral Left  Result Date: 06/03/2022 CLINICAL DATA:  Left calf swelling/pain EXAM: LEFT LOWER EXTREMITY VENOUS DOPPLER ULTRASOUND TECHNIQUE: Gray-scale sonography with compression, as well as color and duplex ultrasound, were performed to evaluate the deep venous system(s) from the level of the  common femoral vein through the popliteal and proximal calf veins. COMPARISON:  None Available. FINDINGS: VENOUS Normal compressibility of the common femoral, superficial femoral, and popliteal veins, as well as the visualized calf veins. Visualized portions of profunda femoral vein and great saphenous vein unremarkable. No filling defects to suggest DVT on grayscale or color Doppler imaging. Doppler waveforms show normal direction of venous flow, normal respiratory plasticity and response to augmentation. Limited views of the contralateral common femoral vein are unremarkable. OTHER None. Limitations: none IMPRESSION: Negative. Electronically Signed   By: Charline Bills M.D.   On: 06/03/2022 21:31   DG Foot Complete Left  Result Date: 06/03/2022 CLINICAL DATA:  Left foot pain and swelling, soft tissue wound initial encounter EXAM: LEFT FOOT - COMPLETE 3+ VIEW COMPARISON:  None Available. FINDINGS: Distal soft tissue swelling is noted in the foot. Soft tissue wound is noted along the medial aspect of the first toe. No discrete erosive changes to suggest osteomyelitis are noted. Other focal abnormality is seen. IMPRESSION: Soft tissue swelling and wound along the first toe. No acute bony abnormality to correspond with osteomyelitis is seen. Electronically Signed   By: Alcide Clever M.D.   On: 06/03/2022 20:31    Assessment and Plan Kristina Harris is a 61 year old female with history of poorly controlled noninsulin-dependent diabetes mellitus, hyperlipidemia, hypertension, who presents emergency department for chief concerns of left first toe open wound infection.  Left great toe diabetic infection - Vancomycin and cefepime ordered - Blood cultures x2 neg - Podiatry consultation, Dr. Lilian Kapur -- MRI of the left foot Open wound medial to the distal phalanx of the great toe with associated soft tissue emphysema. No focal fluid collection or foreign body identified in this region. 2. Nonspecific T2  hyperintensity throughout the distal phalanx of the great toe without cortical destruction or abnormal T1 signal. Given proximity to the wound, this is suspicious for early osteomyelitis.  --NPO for surgery today  Diabetes mellitus type 2, noninsulin dependent (HCC) - Insulin SSI with at bedtime coverage ordered - Goal inpatient blood glucose levels 140-180 -- seen by diabetes coordinator ---A1c 10.0 -- start Semglee and adjust according to sugars   Hyperlipidemia - cont atorvastatin   Primary hypertension - Resumed home losartan   DVT prophylaxis: Heparin  Code Status: DNR Diet: Heart healthy/carb modified Consults :Podiatry Level of care: Med-Surg Status is: Inpatient-- left diabetic foot infection    TOTAL TIME TAKING CARE OF THIS PATIENT: 35 minutes.  >50% time spent on counselling and coordination of care  Note: This dictation was prepared with Dragon dictation along with smaller phrase technology. Any transcriptional errors that result from this process are unintentional.  Enedina Finner M.D    [facility]   CC: Primary care physician; Leanna Sato, MD

## 2022-06-05 NOTE — Anesthesia Preprocedure Evaluation (Addendum)
Anesthesia Evaluation  Patient identified by MRN, date of birth, ID band Patient awake    Reviewed: Allergy & Precautions, NPO status , Patient's Chart, lab work & pertinent test results  History of Anesthesia Complications (+) PONV and history of anesthetic complications  Airway Mallampati: III  TM Distance: >3 FB Neck ROM: full    Dental  (+) Edentulous Upper, Edentulous Lower   Pulmonary neg pulmonary ROS, former smoker,    Pulmonary exam normal breath sounds clear to auscultation       Cardiovascular hypertension, On Medications negative cardio ROS Normal cardiovascular exam Rhythm:regular Rate:Normal     Neuro/Psych negative neurological ROS  negative psych ROS   GI/Hepatic negative GI ROS, Neg liver ROS,   Endo/Other  negative endocrine ROSdiabetes, Poorly Controlled, Type 2Last victoza dose 10/2  Renal/GU      Musculoskeletal   Abdominal   Peds  Hematology negative hematology ROS (+)   Anesthesia Other Findings Past Medical History: No date: Diabetes mellitus without complication (HCC) No date: Hypertension  Past Surgical History: 2015: BREAST BIOPSY; Right  BMI    Body Mass Index: 30.07 kg/m      Reproductive/Obstetrics negative OB ROS                           Anesthesia Physical Anesthesia Plan  ASA: 3  Anesthesia Plan: General   Post-op Pain Management: Toradol IV (intra-op)*, Ofirmev IV (intra-op)* and Dilaudid IV   Induction: Intravenous  PONV Risk Score and Plan: 4 or greater and Dexamethasone, Ondansetron, Midazolam and Treatment may vary due to age or medical condition  Airway Management Planned: Oral ETT  Additional Equipment:   Intra-op Plan:   Post-operative Plan: Extubation in OR  Informed Consent: I have reviewed the patients History and Physical, chart, labs and discussed the procedure including the risks, benefits and alternatives for the  proposed anesthesia with the patient or authorized representative who has indicated his/her understanding and acceptance.     Dental Advisory Given  Plan Discussed with: Anesthesiologist, CRNA and Surgeon  Anesthesia Plan Comments: (Patient consented for risks of anesthesia including but not limited to:  - adverse reactions to medications - aspiration  - damage to eyes, teeth, lips or other oral mucosa - nerve damage due to positioning  - sore throat or hoarseness - Damage to heart, brain, nerves, lungs, other parts of body or loss of life  Patient voiced understanding.)        Anesthesia Quick Evaluation

## 2022-06-05 NOTE — Interval H&P Note (Signed)
History and Physical Interval Note:  06/05/2022 2:59 PM  [name]  has presented today for surgery, with the diagnosis of Left Great Toe Amputation.  The various methods of treatment have been discussed with the patient and family. After consideration of risks, benefits and other options for treatment, the patient has consented to  Procedure(s): AMPUTATION GREAT TOE-LEFT (Left) as a surgical intervention.  The patient's history has been reviewed, patient examined, no change in status, stable for surgery.  I have reviewed the patient's chart and labs.  Questions were answered to the patient's satisfaction.     Kristina Harris

## 2022-06-06 ENCOUNTER — Inpatient Hospital Stay: Payer: BC Managed Care – PPO | Admitting: Anesthesiology

## 2022-06-06 ENCOUNTER — Encounter
Admission: EM | Disposition: A | Discharge status: Home / Self Care | Payer: Self-pay | Source: Ambulatory Visit | Attending: Internal Medicine

## 2022-06-06 ENCOUNTER — Other Ambulatory Visit: Payer: Self-pay

## 2022-06-06 DIAGNOSIS — L089 Local infection of the skin and subcutaneous tissue, unspecified: Secondary | ICD-10-CM | POA: Diagnosis not present

## 2022-06-06 DIAGNOSIS — E11628 Type 2 diabetes mellitus with other skin complications: Secondary | ICD-10-CM | POA: Diagnosis not present

## 2022-06-06 HISTORY — PX: AMPUTATION TOE: SHX6595

## 2022-06-06 LAB — GLUCOSE, CAPILLARY
Glucose-Capillary: 178 mg/dL — ABNORMAL HIGH (ref 70–99)
Glucose-Capillary: 96 mg/dL (ref 70–99)
Glucose-Capillary: 86 mg/dL (ref 70–99)
Glucose-Capillary: 86 mg/dL (ref 70–99)
Glucose-Capillary: 216 mg/dL — ABNORMAL HIGH (ref 70–99)

## 2022-06-06 SURGERY — AMPUTATION, TOE
Anesthesia: General | Site: Toe | Laterality: Left

## 2022-06-06 SURGERY — AMPUTATION, TOE
Anesthesia: Choice | Site: Toe | Laterality: Left

## 2022-06-06 MED ORDER — OXYCODONE HCL 5 MG/5ML PO SOLN: 5.0000 mg | Freq: Once | ORAL | Status: DC | PRN

## 2022-06-06 MED ORDER — MIDAZOLAM HCL 2 MG/2ML IJ SOLN
INTRAMUSCULAR | Status: AC
  Filled 2022-06-06: qty 2

## 2022-06-06 MED ORDER — LIDOCAINE HCL (PF) 2 % IJ SOLN
INTRAMUSCULAR | Status: AC
  Filled 2022-06-06: qty 5

## 2022-06-06 MED ORDER — BUPIVACAINE HCL 0.5 % IJ SOLN
INTRAMUSCULAR | Status: DC | PRN
  Administered 2022-06-06: 10 mL

## 2022-06-06 MED ORDER — HYDROMORPHONE HCL 1 MG/ML IJ SOLN: 0.2500 mg | INTRAMUSCULAR | Status: DC | PRN

## 2022-06-06 MED ORDER — MIDAZOLAM HCL 2 MG/2ML IJ SOLN
INTRAMUSCULAR | Status: DC | PRN
  Administered 2022-06-06: 2 mg via INTRAVENOUS

## 2022-06-06 MED ORDER — PROPOFOL 10 MG/ML IV BOLUS
INTRAVENOUS | Status: AC
  Filled 2022-06-06: qty 20

## 2022-06-06 MED ORDER — PROPOFOL 500 MG/50ML IV EMUL
INTRAVENOUS | Status: DC | PRN
  Administered 2022-06-06: 100 ug/kg/min via INTRAVENOUS

## 2022-06-06 MED ORDER — 0.9 % SODIUM CHLORIDE (POUR BTL) OPTIME
TOPICAL | Status: DC | PRN
  Administered 2022-06-06: 500 mL

## 2022-06-06 MED ORDER — FENTANYL CITRATE (PF) 100 MCG/2ML IJ SOLN
INTRAMUSCULAR | Status: AC
  Filled 2022-06-06: qty 2

## 2022-06-06 MED ORDER — OXYCODONE HCL 5 MG PO TABS: 5.0000 mg | ORAL_TABLET | Freq: Once | ORAL | Status: DC | PRN

## 2022-06-06 MED ORDER — SODIUM CHLORIDE 0.9 % IV SOLN: INTRAVENOUS | Status: DC | PRN

## 2022-06-06 MED ORDER — PROPOFOL 10 MG/ML IV BOLUS
INTRAVENOUS | Status: DC | PRN
  Administered 2022-06-06: 40 mg via INTRAVENOUS

## 2022-06-06 MED ORDER — LIDOCAINE HCL (CARDIAC) PF 100 MG/5ML IV SOSY
PREFILLED_SYRINGE | INTRAVENOUS | Status: DC | PRN
  Administered 2022-06-06: 80 mg via INTRAVENOUS

## 2022-06-06 MED ORDER — BUPIVACAINE HCL (PF) 0.5 % IJ SOLN
INTRAMUSCULAR | Status: AC
  Filled 2022-06-06: qty 30

## 2022-06-06 SURGICAL SUPPLY — 44 items
BLADE MED AGGRESSIVE (BLADE) ×1 IMPLANT
BLADE OSC/SAGITTAL MD 5.5X18 (BLADE) IMPLANT
BLADE SURG 15 STRL LF DISP TIS (BLADE) IMPLANT
BLADE SURG 15 STRL SS (BLADE)
BLADE SURG MINI STRL (BLADE) IMPLANT
BNDG ELASTIC 4X5.8 VLCR STR LF (GAUZE/BANDAGES/DRESSINGS) ×1 IMPLANT
BNDG ESMARK 4X12 TAN STRL LF (GAUZE/BANDAGES/DRESSINGS) ×1 IMPLANT
BNDG GAUZE DERMACEA FLUFF 4 (GAUZE/BANDAGES/DRESSINGS) ×1 IMPLANT
CNTNR SPEC 2.5X3XGRAD LEK (MISCELLANEOUS) ×1
CONT SPEC 4OZ STER OR WHT (MISCELLANEOUS) ×1
CONTAINER SPEC 2.5X3XGRAD LEK (MISCELLANEOUS) ×1 IMPLANT
CUFF TOURN SGL QUICK 12 (TOURNIQUET CUFF) IMPLANT
CUFF TOURN SGL QUICK 18X4 (TOURNIQUET CUFF) IMPLANT
DRAPE FLUOR MINI C-ARM 54X84 (DRAPES) IMPLANT
DURAPREP 26ML APPLICATOR (WOUND CARE) ×1 IMPLANT
ELECT REM PT RETURN 9FT ADLT (ELECTROSURGICAL) ×1
ELECTRODE REM PT RTRN 9FT ADLT (ELECTROSURGICAL) ×1 IMPLANT
GAUZE SPONGE 4X4 12PLY STRL (GAUZE/BANDAGES/DRESSINGS) ×2 IMPLANT
GAUZE STRETCH 2X75IN STRL (MISCELLANEOUS) IMPLANT
GAUZE XEROFORM 1X8 LF (GAUZE/BANDAGES/DRESSINGS) ×1 IMPLANT
GLOVE BIO SURGEON STRL SZ8 (GLOVE) ×1 IMPLANT
GLOVE BIOGEL PI IND STRL 8 (GLOVE) ×1 IMPLANT
GOWN STRL REUS W/ TWL LRG LVL3 (GOWN DISPOSABLE) ×2 IMPLANT
GOWN STRL REUS W/TWL LRG LVL3 (GOWN DISPOSABLE) ×2
HANDPIECE VERSAJET DEBRIDEMENT (MISCELLANEOUS) IMPLANT
IV NS IRRIG 3000ML ARTHROMATIC (IV SOLUTION) IMPLANT
KIT TURNOVER KIT A (KITS) ×1 IMPLANT
LABEL OR SOLS (LABEL) ×1 IMPLANT
MANIFOLD NEPTUNE II (INSTRUMENTS) ×1 IMPLANT
NEEDLE FILTER BLUNT 18X1 1/2 (NEEDLE) ×1 IMPLANT
NEEDLE HYPO 25X1 1.5 SAFETY (NEEDLE) ×2 IMPLANT
NS IRRIG 500ML POUR BTL (IV SOLUTION) ×1 IMPLANT
PACK EXTREMITY ARMC (MISCELLANEOUS) ×1 IMPLANT
SOL PREP PVP 2OZ (MISCELLANEOUS) ×1
SOLUTION PREP PVP 2OZ (MISCELLANEOUS) ×1 IMPLANT
STOCKINETTE STRL 6IN 960660 (GAUZE/BANDAGES/DRESSINGS) ×1 IMPLANT
SUT ETHILON 3-0 FS-10 30 BLK (SUTURE) ×2
SUT VIC AB 3-0 SH 27 (SUTURE) ×1
SUT VIC AB 3-0 SH 27X BRD (SUTURE) ×1 IMPLANT
SUTURE EHLN 3-0 FS-10 30 BLK (SUTURE) ×2 IMPLANT
SWAB CULTURE AMIES ANAERIB BLU (MISCELLANEOUS) IMPLANT
SYR 10ML LL (SYRINGE) ×1 IMPLANT
TRAP FLUID SMOKE EVACUATOR (MISCELLANEOUS) ×1 IMPLANT
WATER STERILE IRR 500ML POUR (IV SOLUTION) ×1 IMPLANT

## 2022-06-06 SURGICAL SUPPLY — 31 items
BLADE SURG 15 STRL LF DISP TIS (BLADE) IMPLANT
BLADE SURG 15 STRL SS (BLADE) ×1
BLADE SURG MINI STRL (BLADE) IMPLANT
BNDG ELASTIC 4X5.8 VLCR STR LF (GAUZE/BANDAGES/DRESSINGS) ×1 IMPLANT
BNDG ESMARK 4X12 TAN STRL LF (GAUZE/BANDAGES/DRESSINGS) ×1 IMPLANT
BNDG GAUZE DERMACEA FLUFF 4 (GAUZE/BANDAGES/DRESSINGS) ×1 IMPLANT
DRSG XEROFORM 1X8 (GAUZE/BANDAGES/DRESSINGS) IMPLANT
ELECT REM PT RETURN 9FT ADLT (ELECTROSURGICAL) ×1
ELECTRODE REM PT RTRN 9FT ADLT (ELECTROSURGICAL) ×1 IMPLANT
GAUZE SPONGE 4X4 12PLY STRL (GAUZE/BANDAGES/DRESSINGS) ×2 IMPLANT
GAUZE STRETCH 2X75IN STRL (MISCELLANEOUS) IMPLANT
GAUZE XEROFORM 1X8 LF (GAUZE/BANDAGES/DRESSINGS) ×1 IMPLANT
GOWN STRL REUS W/ TWL LRG LVL3 (GOWN DISPOSABLE) ×2 IMPLANT
GOWN STRL REUS W/TWL LRG LVL3 (GOWN DISPOSABLE) ×2
KIT TURNOVER KIT A (KITS) ×1 IMPLANT
LABEL OR SOLS (LABEL) ×1 IMPLANT
MANIFOLD NEPTUNE II (INSTRUMENTS) ×1 IMPLANT
NDL FILTER BLUNT 18X1 1/2 (NEEDLE) ×1 IMPLANT
NEEDLE FILTER BLUNT 18X1 1/2 (NEEDLE) ×1 IMPLANT
NS IRRIG 500ML POUR BTL (IV SOLUTION) ×1 IMPLANT
PACK EXTREMITY ARMC (MISCELLANEOUS) ×1 IMPLANT
SOL PREP PVP 2OZ (MISCELLANEOUS) ×1
SOLUTION PREP PVP 2OZ (MISCELLANEOUS) ×1 IMPLANT
STOCKINETTE STRL 6IN 960660 (GAUZE/BANDAGES/DRESSINGS) ×1 IMPLANT
SUT ETHILON 3-0 FS-10 30 BLK (SUTURE) ×3
SUT VIC AB 3-0 SH 27 (SUTURE) ×2
SUT VIC AB 3-0 SH 27X BRD (SUTURE) ×1 IMPLANT
SUTURE EHLN 3-0 FS-10 30 BLK (SUTURE) ×2 IMPLANT
SYR 10ML LL (SYRINGE) ×1 IMPLANT
TRAP FLUID SMOKE EVACUATOR (MISCELLANEOUS) ×1 IMPLANT
WATER STERILE IRR 500ML POUR (IV SOLUTION) ×1 IMPLANT

## 2022-06-06 NOTE — H&P (Signed)
History and Physical Interval Note:  06/06/2022 5:01 PM  Kristina Harris  has presented today for surgery, with the diagnosis of osteomyelitis, ulcer.  The various methods of treatment have been discussed with the patient and family. After consideration of risks, benefits and other options for treatment, the patient has consented to   Procedure(s): LEFT GREAT TOE AMPUTATION (Left) as a surgical intervention.  The patient's history has been reviewed, patient examined, no change in status, stable for surgery.  I have reviewed the patient's chart and labs.  Questions were answered to the patient's satisfaction.     Criselda Peaches

## 2022-06-06 NOTE — Inpatient Diabetes Management (Addendum)
Inpatient Diabetes Program Recommendations  AACE/ADA: New Consensus Statement on Inpatient Glycemic Control   Target Ranges:  Prepandial:   less than 140 mg/dL      Peak postprandial:   less than 180 mg/dL (1-2 hours)      Critically ill patients:  140 - 180 mg/dL    Latest Reference Range & Units 06/06/22 08:24 06/06/22 11:48  Glucose-Capillary 70 - 99 mg/dL 178 (H) 96    Latest Reference Range & Units 06/05/22 08:04 06/05/22 12:20 06/05/22 15:29 06/05/22 21:26  Glucose-Capillary 70 - 99 mg/dL 141 (H) 109 (H) 94 208 (H)    Latest Reference Range & Units 06/04/22 05:52  Hemoglobin A1C 4.8 - 5.6 % 10.0 (H)   Review of Glycemic Control  Diabetes history: DM2 Outpatient Diabetes medications: Jardiance 10 mg qd, Victoza 1.2 mg qd Current orders for Inpatient glycemic control: Semglee 15 units daily, Novolog 0-15 units TID with meals, Novolog 0-5 units QHS  Inpatient Diabetes Program Recommendations:    Outpatient DM: If patient is discharged on insulin, would recommend once a day basal insulin. If Jardiance and Victoza will be continued outpatient, may want to discharge on Semglee 10 units daily; if so, patient would need Rx for Semglee insulin pens ([id number]) and insulin pen needles (#[id number]).  NOTE: Spoke with patient at bedside regarding DM control and insulin (in case she is discharged new to insulin). Patient confirms that she is taking Jardiance and Victoza as prescribed. Patient thought the Victoza was insulin. She reports that her PCP manages her DM but she feels that her PCP is not communicating things she needs to know. Patient reports that when labs were last done by PCP, she was never informed of results and has no idea what her prior A1C has been. Reminded patient of current A1C 10% indicating an average glucose of 240 mg/dl. Patient states that her glucose is usually in the 200's at home and she usually checks it once a day when she has time.  Discussed glucose and A1C goals.  Discussed importance of checking CBGs and maintaining good CBG control to prevent long-term and short-term complications. Explained how hyperglycemia leads to damage within blood vessels which lead to the common complications seen with uncontrolled diabetes. Stressed to the patient the importance of improving glycemic control to prevent further complications from uncontrolled diabetes especially following surgery. Patient states that she would be willing to take insulin at home (if prescribed at discharge) and is familiar with SQ injection since the Victoza is a SQ injection using a pen. Discussed insulin storage, injection sites, and importance of rotating injection sites.  Educated patient on insulin pen use at home. Reviewed all steps of insulin pen including attachment of needle, 2-unit air shot, dialing up dose, giving injection, removing needle, disposal of sharps, storage of unused insulin, disposal of insulin etc. Patient able to provide successful return demonstration and reports that it is the same technique she uses for the Victoza. Patient reports that she has never seen an Endocrinologist. Encouraged patient to call PCP about getting referral to local Endocrinology and if she does not need a referral, she may want to call [facility] to see if she can make an appointment to establish care.  Patient verbalized understanding of information discussed and reports no further questions at this time related to diabetes.  Thanks, Barnie Alderman, RN, MSN, CDE Diabetes Coordinator Inpatient Diabetes Program [phone] (Team Pager)

## 2022-06-06 NOTE — Brief Op Note (Signed)
06/06/2022  5:45 PM  PATIENT:  Kristina Harris  61 y.o. female  PRE-OPERATIVE DIAGNOSIS:  osteomyelitis  POST-OPERATIVE DIAGNOSIS:  osteomyelitis  PROCEDURE:  Procedure(s): LEFT GREAT TOE AMPUTATION (Left)  SURGEON:  Surgeon(s) and Role:    * Soni Kegel, Stephan Minister, DPM - Primary    ASSISTANTS: none   ANESTHESIA:   local and MAC  EBL:  30cc   BLOOD ADMINISTERED:none  DRAINS: none   LOCAL MEDICATIONS USED:  MARCAINE    and Amount: 10 ml  SPECIMEN:  Source of Specimen:  left hallux  DISPOSITION OF SPECIMEN:  PATHOLOGY  COUNTS:  YES  TOURNIQUET:  * Missing tourniquet times found for documented tourniquets in log: [id number] *  DICTATION: .Note written in EPIC  PLAN OF CARE: Admit to inpatient   PATIENT DISPOSITION:  PACU - hemodynamically stable.   Delay start of Pharmacological VTE agent (>24hrs) due to surgical blood loss or risk of bleeding: no   - Can d/c home Saturday 10/7 - WBAT LLE in post op shoe. Ordered - 2 weeks Doxycycline on discharge - My office will arrange follow up   Lanae Crumbly, DPM 06/06/2022

## 2022-06-06 NOTE — Transfer of Care (Signed)
Immediate Anesthesia Transfer of Care Note  Patient: Kristina Harris  Procedure(s) Performed: LEFT GREAT TOE AMPUTATION (Left: Toe)  Patient Location: PACU  Anesthesia Type:General  Level of Consciousness: awake  Airway & Oxygen Therapy: Patient Spontanous Breathing  Post-op Assessment: Report given to RN and Post -op Vital signs reviewed and stable  Post vital signs: Reviewed and stable  Last Vitals:  Vitals Value Taken Time  BP 145/74 06/06/22 1750  Temp    Pulse 79 06/06/22 1755  Resp 17 06/06/22 1755  SpO2 100 % 06/06/22 1755  Vitals shown include unvalidated device data.  Last Pain:  Vitals:   06/06/22 0900  TempSrc:   PainSc: 0-No pain         Complications: No notable events documented.

## 2022-06-06 NOTE — Progress Notes (Signed)
[facility]  - [place] [facility]   PATIENT NAME: Kristina Harris    MR#:  [id number]  DATE OF BIRTH:  1960-11-21  SUBJECTIVE:  no family at bedside. Patient came in with blister on the left foot which burst and started having significant discharge. Foot surgery got cancelled yday  NPO for foot surgery today   VITALS:  Blood pressure (!) 145/74, pulse 80, temperature 98.5 F (36.9 C), temperature source Oral, resp. rate 16, height 5\' 7"  (1.702 m), weight 87.1 kg, SpO2 98 %.  PHYSICAL EXAMINATION:   GENERAL:  61 y.o.-year-old patient lying in the bed with no acute distress.  LUNGS: Normal breath sounds bilaterally CARDIOVASCULAR: S1, S2 normal. No murmurs  ABDOMEN: Soft, nontender, nondistended. Bowel sounds present.  EXTREMITIES:   NEUROLOGIC: nonfocal  patient is alert and awake SKIN: No obvious rash, lesion, or ulcer.   LABORATORY PANEL:  CBC Recent Labs  Lab 06/04/22 0552  WBC 8.3  HGB 11.3*  HCT 35.9*  PLT 402*     Chemistries  Recent Labs  Lab 06/03/22 2019 06/04/22 0552 06/05/22 0359  NA 137   < > 138  K 3.6   < > 3.9  CL 101   < > 107  CO2 26   < > 24  GLUCOSE 152*   < > 185*  BUN 9   < > 9  CREATININE 0.77   < > 0.57  CALCIUM 9.3   < > 8.4*  AST 16  --   --   ALT 18  --   --   ALKPHOS 83  --   --   BILITOT 0.5  --   --    < > = values in this interval not displayed.    Cardiac Enzymes No results for input(s): "TROPONINI" in the last 168 hours. RADIOLOGY:  MR FOOT LEFT WO CONTRAST  Result Date: 06/05/2022 CLINICAL DATA:  Diabetic with great toe wound and soft tissue swelling. Clinical concern for osteomyelitis. EXAM: MRI OF THE LEFT FOOT WITHOUT CONTRAST TECHNIQUE: Multiplanar, multisequence MR imaging of the left forefoot was performed. No intravenous contrast was administered. COMPARISON:  Radiographs 06/03/2022 FINDINGS: Bones/Joint/Cartilage Best seen on the recent radiographs, there is soft tissue swelling in the  great toe with an open wound medial to the distal phalanx with associated soft tissue emphysema. There is abnormal T2 hyperintensity throughout the distal phalanx without cortical destruction or abnormal signal on the T1 weighted images. There is no interphalangeal or metatarsal-phalangeal joint effusion. The proximal phalanx and 1st metatarsal appear unremarkable aside from mild degenerative changes. Mild midfoot degenerative changes without subluxation. Ligaments The Lisfranc ligament is intact. The collateral ligaments of the metatarsophalangeal joints are intact. Muscles and Tendons The forefoot muscles and tendons appear normal. No significant tenosynovitis. Soft tissues As above, soft tissue swelling in the great toe with an open wound medial to the distal phalanx. No focal fluid collection or foreign body identified in this region. There is susceptibility artifact superficially in the lateral plantar forefoot, corresponding with a tiny metallic foreign body on prior radiographs. No focal inflammation identified in this region. Moderate nonspecific dorsal subcutaneous edema. IMPRESSION: 1. Open wound medial to the distal phalanx of the great toe with associated soft tissue emphysema. No focal fluid collection or foreign body identified in this region. 2. Nonspecific T2 hyperintensity throughout the distal phalanx of the great toe without cortical destruction or abnormal T1 signal. Given proximity to the wound, this is suspicious for early osteomyelitis.  3. No evidence of septic arthritis or tenosynovitis. 4. Nonspecific dorsal subcutaneous edema. Electronically Signed   By: Richardean Sale M.D.   On: 06/05/2022 08:10    Assessment and Plan Kristina Harris is a 61 year old female with history of poorly controlled noninsulin-dependent diabetes mellitus, hyperlipidemia, hypertension, who presents emergency department for chief concerns of left first toe open wound infection.  Left great toe diabetic  infection - Vancomycin and cefepime ordered - Blood cultures x2 neg - Podiatry consultation, Dr. Sherryle Lis -- MRI of the left foot Open wound medial to the distal phalanx of the great toe with associated soft tissue emphysema. No focal fluid collection or foreign body identified in this region. 2. Nonspecific T2 hyperintensity throughout the distal phalanx of the great toe without cortical destruction or abnormal T1 signal. Given proximity to the wound, this is suspicious for early osteomyelitis.  --NPO for surgery today  Diabetes mellitus type 2, noninsulin dependent ([place]) - Insulin SSI with at bedtime coverage ordered - Goal inpatient blood glucose levels 140-180 -- seen by diabetes coordinator ---A1c 10.0 -- start Semglee and adjust according to sugars   Hyperlipidemia - cont atorvastatin   Primary hypertension - Resumed home losartan   DVT prophylaxis: Heparin  Code Status: DNR Diet: Heart healthy/carb modified Consults :Podiatry Level of care: Med-Surg Status is: Inpatient-- left diabetic foot infection    TOTAL TIME TAKING CARE OF THIS PATIENT: 35 minutes.  >50% time spent on counselling and coordination of care  Note: This dictation was prepared with Dragon dictation along with smaller phrase technology. Any transcriptional errors that result from this process are unintentional.  Fritzi Mandes M.D    [facility]   CC: Primary care physician; Marguerita Merles, MD

## 2022-06-06 NOTE — Anesthesia Procedure Notes (Signed)
Date/Time: 06/06/2022 5:17 PM  Performed by: Doreen Salvage, CRNAPre-anesthesia Checklist: Patient identified, Emergency Drugs available, Suction available and Patient being monitored Patient Re-evaluated:Patient Re-evaluated prior to induction Oxygen Delivery Method: Simple face mask Induction Type: IV induction Dental Injury: Teeth and Oropharynx as per pre-operative assessment

## 2022-06-06 NOTE — Discharge Instructions (Signed)
[facility] [address] [place], [place] [address] [phone]

## 2022-06-06 NOTE — Anesthesia Postprocedure Evaluation (Signed)
Anesthesia Post Note  Patient: Chrislynn Mosely  Procedure(s) Performed: LEFT GREAT TOE AMPUTATION (Left: Toe)  Patient location during evaluation: PACU Anesthesia Type: General Level of consciousness: awake and alert Pain management: pain level controlled Vital Signs Assessment: post-procedure vital signs reviewed and stable Respiratory status: spontaneous breathing, nonlabored ventilation, respiratory function stable and patient connected to nasal cannula oxygen Cardiovascular status: blood pressure returned to baseline and stable Postop Assessment: no apparent nausea or vomiting Anesthetic complications: no   No notable events documented.   Last Vitals:  Vitals:   06/06/22 1815 06/06/22 2113  BP:  (!) 141/70  Pulse: 76 79  Resp: 14 16  Temp: 36.4 C 36.7 C  SpO2: 97% 100%    Last Pain:  Vitals:   06/06/22 1815  TempSrc:   PainSc: 0-No pain                 Dimas Millin

## 2022-06-07 ENCOUNTER — Encounter: Payer: Self-pay | Admitting: Podiatry

## 2022-06-07 LAB — GLUCOSE, CAPILLARY: Glucose-Capillary: 107 mg/dL — ABNORMAL HIGH (ref 70–99)

## 2022-06-07 MED ORDER — CLOTRIMAZOLE 1 % VA CREA
1.0000 | TOPICAL_CREAM | Freq: Every day | VAGINAL | Status: DC
  Filled 2022-06-07: qty 45

## 2022-06-07 MED ORDER — "PEN NEEDLES 3/16"" 31G X 5 MM MISC": 1.0000 | Freq: Three times a day (TID) | 2 refills | Status: AC

## 2022-06-07 MED ORDER — INSULIN GLARGINE-YFGN 100 UNIT/ML ~~LOC~~ SOLN: 15.0000 [IU] | Freq: Every day | SUBCUTANEOUS | 11 refills | Status: AC

## 2022-06-07 MED ORDER — CLOTRIMAZOLE 1 % VA CREA: 1.0000 | TOPICAL_CREAM | Freq: Every day | VAGINAL | 0 refills | Status: AC

## 2022-06-07 MED ORDER — EMPAGLIFLOZIN 10 MG PO TABS
10.0000 mg | ORAL_TABLET | Freq: Every day | ORAL | Status: DC
  Administered 2022-06-07: 10 mg via ORAL
  Filled 2022-06-07: qty 1

## 2022-06-07 MED ORDER — DOXYCYCLINE HYCLATE 100 MG PO TABS
100.0000 mg | ORAL_TABLET | Freq: Two times a day (BID) | ORAL | Status: DC
  Administered 2022-06-07: 100 mg via ORAL
  Filled 2022-06-07: qty 1

## 2022-06-07 MED ORDER — AMLODIPINE BESYLATE 5 MG PO TABS: 5.0000 mg | ORAL_TABLET | Freq: Every day | ORAL | 1 refills | Status: AC

## 2022-06-07 MED ORDER — DOXYCYCLINE HYCLATE 100 MG PO TABS: 100.0000 mg | ORAL_TABLET | Freq: Two times a day (BID) | ORAL | 0 refills | Status: AC

## 2022-06-07 NOTE — Plan of Care (Signed)

## 2022-06-07 NOTE — Op Note (Signed)
Patient Name: Kristina Harris DOB: [birth year]  MRN: [id number]   Date of Service: 06/06/2022  Surgeon: Dr. Lanae Crumbly, DPM Assistants: None Pre-operative Diagnosis:  Left great toe infection, ulceration, osteomyelitis Post-operative Diagnosis:  Left great toe infection, ulceration, osteomyelitis Procedures:  1) amputation great toe left partial Pathology/Specimens: ID Type Source Tests Collected by Time Destination  1 : Left great toe Tissue PATH Digit amputation SURGICAL PATHOLOGY Criselda Peaches, DPM 06/06/2022 1726    Anesthesia: MAC with local Hemostasis: * Missing tourniquet times found for documented tourniquets in log: [id number] * Estimated Blood Loss: 30 cc Materials: * No implants in log * Medications: 10 cc 0.5% Marcaine plain Complications: No complication noted  Indications for Procedure:  This is a 61 y.o. 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  She presented with cellulitis and possible osteomyelitis noted on MRI.  Due to the severity of the soft tissue loss I recommended amputation which would limit any osteomyelitis as well.  All questions were addressed prior to surgery   Procedure in Detail: Patient was identified in pre-operative holding area. Formal consent was signed and the left lower extremity was marked. Patient was brought back to the operating room. Anesthesia was induced. The extremity was prepped and draped in the usual sterile fashion. Timeout was taken to confirm patient name, laterality, and procedure prior to incision.   Attention was then directed to the left great toe where an incision was made circumferentially around the distal toe around the IPJ. Dissection was carried down to level of bone.  Dissection was continued to the interval she will joint and all collateral ligaments were freed at the joint.  The proximal phalanx was resected with a bone cutting forceps, this portion as well  as the distal phalanx and associated soft tissue were removed and passed for pathology.  The remaining metatarsal head appeared healthy and viable.  The area was copiously irrigated.  The skin was reapproximated with Monocryl and nylon.   The foot was then dressed with Xeroform dry sterile dressings Ace wrap under light compression. Patient tolerated the procedure well.   Disposition: Following a period of post-operative monitoring, patient will be transferred to the floor.

## 2022-06-07 NOTE — Progress Notes (Signed)
Patient is alert and oriented x4. Complained of pain x2 and had tylenol in which she stated worked for her. Can wiggle other 4 toes on left side and has sensation in them. No drainage from wound. Patient complained of vaginal itching and burning that she stated may be a yeast infection. Asked patient how long has she felt this and she stated 2 days. Asked patient why she had not reported this before and she stated that she wanted to make sure that it was going to be something that was persistent before she reported it. Upon vaginal exam, saw some white discharge. Reported this to NP Hassan Rowan whom ordered pt some vaginal cream QHS. Went into patient's room at 0430 to check on her and she stated that her IV was leaking-removed IV and placed an IV consult for new IV. Denied additional needs.

## 2022-06-07 NOTE — Discharge Summary (Addendum)
Physician Discharge Summary   Patient: Kristina Harris MRN: [id number] DOB: Jan 23, 1961  Admit date:     06/03/2022  Discharge date: 06/07/22  Discharge Physician: Fritzi Mandes   PCP: Marguerita Merles, MD   Recommendations at discharge:    F/u podiatry in 1 week Keep your current dressing on till you see Podiatry next week  Discharge Diagnoses: Principal Problem:   Diabetic foot infection ([place]) Active Problems:   Primary hypertension   Hyperlipidemia   Diabetes mellitus type 2, noninsulin dependent ([place])   Diabetic foot ([place])   Cellulitis of left lower extremity   Hospital Course:  Kristina Harris is a 61 year old female with history of poorly controlled noninsulin-dependent diabetes mellitus, hyperlipidemia, hypertension, who presents emergency department for chief concerns of left first toe open wound infection.   Left great toe diabetic infection - Vancomycin and cefepime ordered - Blood cultures x2 neg - Podiatry consultation, Dr. Sherryle Lis -- MRI of the left foot Open wound medial to the distal phalanx of the great toe with associated soft tissue emphysema. No focal fluid collection or foreign body identified in this region. 2. Nonspecific T2 hyperintensity throughout the distal phalanx of the great toe without cortical destruction or abnormal T1 signal. Given proximity to the wound, this is suspicious for early osteomyelitis. --s/p left great toe amputation--leaving dressing on--see podiatry next week--stay off work 4 weeks--doxycycline for 2 weeks   Diabetes mellitus type 2, noninsulin dependent ([place]) - Insulin SSI with at bedtime coverage ordered - Goal inpatient blood glucose levels 140-180 -- seen by diabetes coordinator ---A1c 10.0 -- start Semglee and adjust according to sugars --cont jardiance and victoza --pt will benefit from Endocrinology f/u--referral sent   Hyperlipidemia - cont atorvastatin   Primary hypertension - Resumed home losartan     DVT prophylaxis: Heparin  Code Status: DNR Diet: Heart healthy/carb modified Consults :Podiatry      Procedures performed: left great toe amputation  Disposition: Home Diet recommendation:  Discharge Diet Orders (From admission, onward)     Start     Ordered   06/07/22 0000  Diet - low sodium heart healthy        06/07/22 0928           Cardiac and Carb modified diet DISCHARGE MEDICATION: Allergies as of 06/07/2022   No Known Allergies      Medication List     TAKE these medications    amLODipine 5 MG tablet Commonly known as: NORVASC Take 1 tablet (5 mg total) by mouth daily.   atorvastatin 10 MG tablet Commonly known as: LIPITOR Take by mouth.   clotrimazole 1 % vaginal cream Commonly known as: GYNE-LOTRIMIN Place 1 Applicatorful vaginally at bedtime.   doxycycline 100 MG tablet Commonly known as: VIBRA-TABS Take 1 tablet (100 mg total) by mouth every 12 (twelve) hours.   insulin glargine-yfgn 100 UNIT/ML injection Commonly known as: SEMGLEE Inject 0.15 mLs (15 Units total) into the skin daily.   Jardiance 10 MG Tabs tablet Generic drug: empagliflozin Take 10 mg by mouth daily.   losartan 50 MG tablet Commonly known as: COZAAR Take 50 mg by mouth daily.   Pen Needles 3/16" 31G X 5 MM Misc 1 Needle by Does not apply route in the morning, at noon, and at bedtime.   Victoza 18 MG/3ML Sopn Generic drug: liraglutide Inject 1.2 mg into the skin daily.               Discharge Care Instructions  (From admission, onward)  Start     Ordered   06/07/22 0000  Discharge wound care:       Comments: Keep your current dressing on till you see podiatry as out pt   06/07/22 9678            Follow-up Information     Marguerita Merles, MD. Schedule an appointment as soon as possible for a visit in 1 week(s).   Specialty: Family Medicine Contact information: [place] [address] [phone]          Criselda Peaches, DPM. Schedule an appointment as soon as possible for a visit in 1 week(s).   Specialty: Podiatry Why: left great toe amputation Contact information: [place] [address] [phone]                Discharge Exam: Danley Danker Weights   06/03/22 2010 06/05/22 1535  Weight: 87.1 kg 87.1 kg     Condition at discharge: fair  The results of significant diagnostics from this hospitalization (including imaging, microbiology, ancillary and laboratory) are listed below for reference.   Imaging Studies: MR FOOT LEFT WO CONTRAST  Result Date: 06/05/2022 CLINICAL DATA:  Diabetic with great toe wound and soft tissue swelling. Clinical concern for osteomyelitis. EXAM: MRI OF THE LEFT FOOT WITHOUT CONTRAST TECHNIQUE: Multiplanar, multisequence MR imaging of the left forefoot was performed. No intravenous contrast was administered. COMPARISON:  Radiographs 06/03/2022 FINDINGS: Bones/Joint/Cartilage Best seen on the recent radiographs, there is soft tissue swelling in the great toe with an open wound medial to the distal phalanx with associated soft tissue emphysema. There is abnormal T2 hyperintensity throughout the distal phalanx without cortical destruction or abnormal signal on the T1 weighted images. There is no interphalangeal or metatarsal-phalangeal joint effusion. The proximal phalanx and 1st metatarsal appear unremarkable aside from mild degenerative changes. Mild midfoot degenerative changes without subluxation. Ligaments The Lisfranc ligament is intact. The collateral ligaments of the metatarsophalangeal joints are intact. Muscles and Tendons The forefoot muscles and tendons appear normal. No significant tenosynovitis. Soft tissues As above, soft tissue swelling in the great toe with an open wound medial to the distal phalanx. No focal fluid collection or foreign body identified in this region. There is susceptibility artifact superficially in the lateral  plantar forefoot, corresponding with a tiny metallic foreign body on prior radiographs. No focal inflammation identified in this region. Moderate nonspecific dorsal subcutaneous edema. IMPRESSION: 1. Open wound medial to the distal phalanx of the great toe with associated soft tissue emphysema. No focal fluid collection or foreign body identified in this region. 2. Nonspecific T2 hyperintensity throughout the distal phalanx of the great toe without cortical destruction or abnormal T1 signal. Given proximity to the wound, this is suspicious for early osteomyelitis. 3. No evidence of septic arthritis or tenosynovitis. 4. Nonspecific dorsal subcutaneous edema. Electronically Signed   By: Richardean Sale M.D.   On: 06/05/2022 08:10   US Venous Img Lower Unilateral Left  Result Date: 06/03/2022 CLINICAL DATA:  Left calf swelling/pain EXAM: LEFT LOWER EXTREMITY VENOUS DOPPLER ULTRASOUND TECHNIQUE: Gray-scale sonography with compression, as well as color and duplex ultrasound, were performed to evaluate the deep venous system(s) from the level of the common femoral vein through the popliteal and proximal calf veins. COMPARISON:  None Available. FINDINGS: VENOUS Normal compressibility of the common femoral, superficial femoral, and popliteal veins, as well as the visualized calf veins. Visualized portions of profunda femoral vein and great saphenous vein unremarkable. No filling defects  to suggest DVT on grayscale or color Doppler imaging. Doppler waveforms show normal direction of venous flow, normal respiratory plasticity and response to augmentation. Limited views of the contralateral common femoral vein are unremarkable. OTHER None. Limitations: none IMPRESSION: Negative. Electronically Signed   By: Julian Hy M.D.   On: 06/03/2022 21:31   DG Foot Complete Left  Result Date: 06/03/2022 CLINICAL DATA:  Left foot pain and swelling, soft tissue wound initial encounter EXAM: LEFT FOOT - COMPLETE 3+ VIEW  COMPARISON:  None Available. FINDINGS: Distal soft tissue swelling is noted in the foot. Soft tissue wound is noted along the medial aspect of the first toe. No discrete erosive changes to suggest osteomyelitis are noted. Other focal abnormality is seen. IMPRESSION: Soft tissue swelling and wound along the first toe. No acute bony abnormality to correspond with osteomyelitis is seen. Electronically Signed   By: Inez Catalina M.D.   On: 06/03/2022 20:31   MM 3D SCREEN BREAST BILATERAL  Result Date: 05/27/2022 CLINICAL DATA:  Screening. EXAM: DIGITAL SCREENING BILATERAL MAMMOGRAM WITH TOMOSYNTHESIS AND CAD TECHNIQUE: Bilateral screening digital craniocaudal and mediolateral oblique mammograms were obtained. Bilateral screening digital breast tomosynthesis was performed. The images were evaluated with computer-aided detection. COMPARISON:  Previous exam(s). ACR Breast Density Category b: There are scattered areas of fibroglandular density. FINDINGS: There are no findings suspicious for malignancy. IMPRESSION: No mammographic evidence of malignancy. A result letter of this screening mammogram will be mailed directly to the patient. RECOMMENDATION: Screening mammogram in one year. (Code:SM-B-01Y) BI-RADS CATEGORY  1: Negative. Electronically Signed   By: Abelardo Diesel M.D.   On: 05/27/2022 16:15    Microbiology: Results for orders placed or performed during the hospital encounter of 06/03/22  Blood Culture (routine x 2)     Status: None (Preliminary result)   Collection Time: 06/03/22  7:59 PM   Specimen: BLOOD  Result Value Ref Range Status   Specimen Description BLOOD RIGHT ANTECUBITAL  Final   Special Requests   Final    BOTTLES DRAWN AEROBIC AND ANAEROBIC Blood Culture adequate volume   Culture   Final    NO GROWTH 4 DAYS Performed at [facility], [address] [place], [place] [address]    Report Status PENDING  Incomplete  Blood Culture (routine x 2)     Status: None (Preliminary  result)   Collection Time: 06/03/22  8:00 PM   Specimen: BLOOD  Result Value Ref Range Status   Specimen Description BLOOD BLOOD RIGHT FOREARM  Final   Special Requests   Final    BOTTLES DRAWN AEROBIC AND ANAEROBIC Blood Culture adequate volume   Culture   Final    NO GROWTH 4 DAYS Performed at [facility], [address] [place], [place] [address]    Report Status PENDING  Incomplete    Labs: CBC: Recent Labs  Lab 06/03/22 2019 06/04/22 0552  WBC 10.1 8.3  NEUTROABS 6.3  --   HGB 11.4* 11.3*  HCT 35.8* 35.9*  MCV 79.2* 80.0  PLT 429* [id number]*   Basic Metabolic Panel: Recent Labs  Lab 06/03/22 2019 06/04/22 0552 06/05/22 0359  NA 137 138 138  K 3.6 3.9 3.9  CL 101 109 107  CO2 26 25 24   GLUCOSE 152* 207* 185*  BUN 9 8 9   CREATININE 0.77 0.68 0.57  CALCIUM 9.3 8.8* 8.4*   Liver Function Tests: Recent Labs  Lab 06/03/22 2019  AST 16  ALT 18  ALKPHOS 83  BILITOT 0.5  PROT 8.3*  ALBUMIN 3.8   CBG: Recent Labs  Lab 06/06/22 1148 06/06/22 1646 06/06/22 1753 06/06/22 2113 06/07/22 0832  GLUCAP 96 86 86 216* 107*    Discharge time spent: greater than 30 minutes.  Signed: Fritzi Mandes, MD [facility] 06/07/2022

## 2022-06-07 NOTE — Progress Notes (Signed)
Cross cover Patient reports to nurse vaginal itching and irritation. White discharge consistent with candidiasis Started on vaginal cream  Kathlene Cote NP  [facility]

## 2022-06-08 LAB — CULTURE, BLOOD (ROUTINE X 2)
Culture: NO GROWTH
Culture: NO GROWTH
Special Requests: ADEQUATE
Special Requests: ADEQUATE

## 2022-06-10 LAB — SURGICAL PATHOLOGY

## 2022-06-11 ENCOUNTER — Ambulatory Visit (INDEPENDENT_AMBULATORY_CARE_PROVIDER_SITE_OTHER): Payer: BC Managed Care – PPO | Admitting: Podiatry

## 2022-06-11 DIAGNOSIS — M869 Osteomyelitis, unspecified: Secondary | ICD-10-CM

## 2022-06-15 ENCOUNTER — Encounter: Payer: Self-pay | Admitting: Podiatry

## 2022-06-15 NOTE — Progress Notes (Signed)
  Subjective:  Patient ID: Kristina Harris, female    DOB: April 07, 1961,  MRN: [id number]  Chief Complaint  Patient presents with   Routine Post Op    POV #1 DOS 06/06/22 Amputation left great toe     61 y.o. female returns for post-op check.  She is doing well no problems  Review of Systems: Negative except as noted in the HPI. Denies N/V/F/Ch.   Objective:  There were no vitals filed for this visit. There is no height or weight on file to calculate BMI. Constitutional Well developed. Well nourished.  Vascular Foot warm and well perfused. Capillary refill normal to all digits.  Calf is soft and supple, no posterior calf or knee pain, negative Homans' sign  Neurologic Normal speech. Oriented to person, place, and time. Epicritic sensation to light touch grossly present bilaterally.  Dermatologic Skin healing well without signs of infection. Skin edges well coapted without signs of infection.  Orthopedic: Tenderness to palpation noted about the surgical site.    Assessment:   1. Osteomyelitis of great toe of left foot ([place])    Plan:  Patient was evaluated and treated and all questions answered.  S/p foot surgery left -Progressing as expected post-operatively.  -WB Status: WBAT in surgical shoe -Sutures: Plan to remove in 2 weeks. -Medications: She should complete her p.o. antibiotics -Foot redressed.  Return in about 2 weeks (around 06/25/2022) for post op (no x-rays), suture removal.

## 2022-06-25 ENCOUNTER — Ambulatory Visit (INDEPENDENT_AMBULATORY_CARE_PROVIDER_SITE_OTHER): Payer: BC Managed Care – PPO | Admitting: Podiatry

## 2022-06-25 DIAGNOSIS — T8789 Other complications of amputation stump: Secondary | ICD-10-CM

## 2022-06-25 DIAGNOSIS — M869 Osteomyelitis, unspecified: Secondary | ICD-10-CM | POA: Diagnosis not present

## 2022-06-25 MED ORDER — GENTAMICIN SULFATE 0.1 % EX OINT: 1.0000 | TOPICAL_OINTMENT | Freq: Every day | CUTANEOUS | 0 refills | Status: DC

## 2022-06-25 NOTE — Progress Notes (Signed)
  Subjective:  Patient ID: Kristina Harris, female    DOB: [birth year],  MRN: [id number]  Chief Complaint  Patient presents with   Routine Post Op     POV #2 DOS 06/06/2022 AMPUTATION LT GREAT TOE     61 y.o. female returns for post-op check.  She is doing well  Review of Systems: Negative except as noted in the HPI. Denies N/V/F/Ch.   Objective:  There were no vitals filed for this visit. There is no height or weight on file to calculate BMI. Constitutional Well developed. Well nourished.  Vascular Foot warm and well perfused. Capillary refill normal to all digits.  Calf is soft and supple, no posterior calf or knee pain, negative Homans' sign  Neurologic Normal speech. Oriented to person, place, and time. Epicritic sensation to light touch grossly present bilaterally.  Dermatologic There is some delayed healing of the central plantar portion of the incision with superficial dehiscence  Orthopedic: She has no pain    Assessment:   1. Delayed surgical wound healing of toe amputation stump ([place])   2. Osteomyelitis of great toe of left foot ([place])    Plan:  Patient was evaluated and treated and all questions answered.  S/p foot surgery left -Progressing as expected post-operatively.  -WB Status: WBAT in surgical shoe -Sutures: Removed today -Recommend she continue home care with gentamicin ointment which was sent to her pharmacy.  Wash foot with antibiotic soap daily and apply ointment and gauze dressing with gentle Ace wrap.  I will see her back in 3 weeks for reevaluation  Return in about 3 weeks (around 07/16/2022) for wound care.

## 2022-07-16 ENCOUNTER — Ambulatory Visit (INDEPENDENT_AMBULATORY_CARE_PROVIDER_SITE_OTHER): Payer: BC Managed Care – PPO | Admitting: Podiatry

## 2022-07-16 DIAGNOSIS — M869 Osteomyelitis, unspecified: Secondary | ICD-10-CM

## 2022-07-16 NOTE — Progress Notes (Signed)
  Subjective:  Patient ID: Kristina Harris, female    DOB: [birth year],  MRN: [id number]  Postop follow-up from toe amputation   61 y.o. female returns for post-op check.  Overall she says she is doing well  Review of Systems: Negative except as noted in the HPI. Denies N/V/F/Ch.   Objective:  There were no vitals filed for this visit. There is no height or weight on file to calculate BMI. Constitutional Well developed. Well nourished.  Vascular Foot warm and well perfused. Capillary refill normal to all digits.  Calf is soft and supple, no posterior calf or knee pain, negative Homans' sign  Neurologic Normal speech. Oriented to person, place, and time. Epicritic sensation to light touch grossly present bilaterally.  Dermatologic Wound is fully healed there is dorsal hyperkeratosis and hyperpigmentation  Orthopedic: She has no pain    Assessment:   1. Osteomyelitis of great toe of left foot ([facility])    Plan:  Patient was evaluated and treated and all questions answered.  S/p foot surgery left -Doing very well the wound is fully healed at this point she may return to regular shoe gear as tolerated discussed padding the area with gauze inside of her sock.  We discussed the risk of ulceration of the second toe as well.  I do think she can return to regular shoe gear and plan to return to work 3 weeks once the skin is healed further.  I will see her back in 2 weeks prior to her return to work on December 4  Return in about 2 weeks (around 07/30/2022) for post op (new x-rays).

## 2022-07-28 ENCOUNTER — Ambulatory Visit (INDEPENDENT_AMBULATORY_CARE_PROVIDER_SITE_OTHER): Payer: BC Managed Care – PPO | Admitting: Podiatry

## 2022-07-28 ENCOUNTER — Encounter: Payer: Self-pay | Admitting: *Deleted

## 2022-07-28 DIAGNOSIS — M869 Osteomyelitis, unspecified: Secondary | ICD-10-CM | POA: Diagnosis not present

## 2022-07-28 NOTE — Progress Notes (Signed)
  Subjective:  Patient ID: Kristina Harris, female    DOB: 1961-06-26,  MRN: [id number]  Postop follow-up from toe amputation   61 y.o. female returns for post-op check.  She is doing well she was not able to get her steel toe shoes on it was too tight  Review of Systems: Negative except as noted in the HPI. Denies N/V/F/Ch.   Objective:  There were no vitals filed for this visit. There is no height or weight on file to calculate BMI. Constitutional Well developed. Well nourished.  Vascular Foot warm and well perfused. Capillary refill normal to all digits.  Calf is soft and supple, no posterior calf or knee pain, negative Homans' sign  Neurologic Normal speech. Oriented to person, place, and time. Epicritic sensation to light touch grossly present bilaterally.  Dermatologic Wound is fully healed there is dorsal hyperkeratosis and hyperpigmentation  Orthopedic: She has no pain    Assessment:   1. Osteomyelitis of great toe of left foot ([facility])    Plan:  Patient was evaluated and treated and all questions answered.  S/p foot surgery left -Overall well still has some edema that is making shoe gear difficult.  We will give this more time for the edema to reduce.  She will return to work without restrictions on 09/02/2022.  She will return to see me as needed advised her on possibility and risk of ulceration especially in the second toenail  Return if symptoms worsen or fail to improve.

## 2022-07-29 ENCOUNTER — Telehealth: Payer: Self-pay | Admitting: Podiatry

## 2022-07-29 NOTE — Telephone Encounter (Signed)
Pt called and was seen yesterday and was put on light duty but her work does not have light duty. What would you recommend her to do?

## 2022-07-30 ENCOUNTER — Encounter: Payer: Self-pay | Admitting: Podiatry

## 2022-07-30 NOTE — Telephone Encounter (Signed)
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 She is scheduled to return to work 09/02/2022, full duty with no restrictions.

## 2022-08-05 ENCOUNTER — Encounter: Payer: Self-pay | Admitting: Podiatry

## 2022-09-23 ENCOUNTER — Ambulatory Visit: Payer: BC Managed Care – PPO | Admitting: *Deleted

## 2024-05-31 ENCOUNTER — Ambulatory Visit (INDEPENDENT_AMBULATORY_CARE_PROVIDER_SITE_OTHER)

## 2024-05-31 ENCOUNTER — Other Ambulatory Visit: Payer: Self-pay | Admitting: Podiatry

## 2024-05-31 ENCOUNTER — Ambulatory Visit (INDEPENDENT_AMBULATORY_CARE_PROVIDER_SITE_OTHER): Admitting: Podiatry

## 2024-05-31 DIAGNOSIS — M86172 Other acute osteomyelitis, left ankle and foot: Secondary | ICD-10-CM | POA: Diagnosis not present

## 2024-05-31 DIAGNOSIS — L97529 Non-pressure chronic ulcer of other part of left foot with unspecified severity: Secondary | ICD-10-CM

## 2024-05-31 DIAGNOSIS — E11621 Type 2 diabetes mellitus with foot ulcer: Secondary | ICD-10-CM | POA: Diagnosis not present

## 2024-05-31 DIAGNOSIS — M869 Osteomyelitis, unspecified: Secondary | ICD-10-CM

## 2024-05-31 MED ORDER — GENTAMICIN SULFATE 0.1 % EX OINT: 1.0000 | TOPICAL_OINTMENT | Freq: Every day | CUTANEOUS | 0 refills | Status: AC

## 2024-05-31 NOTE — Progress Notes (Signed)
 Subjective:  Patient ID: Kristina Harris, female    DOB: 11-06-1960,  MRN: [id number]  Chief Complaint  Patient presents with   Foot Ulcer    L 1st met open wound at great toe amputation x  2 weeks. Has been cleaning with soap and peroxide.  Diabetic A1c 10.0  no anti coag    63 y.o. female presents with the above complaint. History confirmed with patient.  She returns for follow-up has developed re ulceration at her previous amputation site  Objective:  Physical Exam: warm, good capillary refill, normal DP and PT pulses, and diffuse peripheral neuropathy with minimal pedal sensation, has full-thickness ulceration measuring 0.6 x 0.4 x 0.3 cm. Granular wound bed surrounding hyperkeratosis no purulence or malodor     Radiographs: Multiple views x-ray of the left foot: no soft tissue emphysema, no signs of osteomyelitis Assessment:   1. Diabetic ulcer of left great toe [facility])      Plan:  Patient was evaluated and treated and all questions answered.   Ulcer left great toe -We discussed the etiology and factors that are a part of the wound healing process.  We also discussed the risk of infection both soft tissue and osteomyelitis from open ulceration.  Discussed the risk of limb loss if this happens or worsens. -Debridement as below. -Dressed with Iodosorb, DSD. -Continue home dressing changes daily with bandaid-type dressing and gentamicin  -Continue off-loading with surgical shoe.  This was dispensed today.  She is not to wear this at work so we will utilize this for outside of work -Vascular testing she is strong palpable pulses -HgbA1c: She has not had her A1c checked she is in the middle of transitioning from previous PCP to a new doctor -Last antibiotics: Wound culture taken today will prescribe oral antibiotics pending culture results.  Rx gentamicin  sent to pharmacy for topical therapy -Imaging: x-ray reviewed, shows no signs of erosions, osteolysis,  osteomyelitis or emphysema.  Procedure: Excisional Debridement of Wound Tool: Sharp #312 chisel blade/tissue nipper Type of Debridement: Sharp Excisional Frequency: @periodically  until appropriately healed.  Dressing is to be changed daily/keeping the wound clean and dry Rationale: Removal of non-viable soft tissue from the wound to promote healing.  Anesthesia: none Pre-Debridement Wound Measurements: Unmeasurable due to hyperkeratosis Post-Debridement Wound Measurements: 0.3 cm x 0.4 cm x 0.3 cm  Area devitalized tissue removed(nonviable tissue only): 0.3 cm x 0.4 cm.  Blood loss: Minimal (<50cc) Hemostasis: manual pressure Depth of Debridement: with fat layer exposed Description of tissue removed: Slough, Devitalized Tissue, and Non-viable tissue Technique: The wound and the surrounding skin were prepped in usual aseptic fashion.  Aseptic technique was maintained throughout the procedure.  Using #312 blade/tissue nipper sharp debridement of necrotic/nonviable tissue was performed until healthy bleeding wound bed was achieved.  No underlying bone or tendon was exposed during debridement.  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  Debridement removed 0.3 cm x 0.4 cm of the necrotic tissue and subcutaneous tissue and no active drainage present Presence/absence of tissue: Necrotic tissue/nonviable tissue present at the base of the wound.  Sharp debridement was performed to remove the necrotic tissue/nonviable tissue back to viable tissue.  No devitalized/nonviable tissue present postdebridement.  Wound appeared clean and clear of infection No material in the wound was present that was identified to be inhibiting healing. Dressing: Dry, sterile, dressing with  Iodosorb on ulcer base followed by Biatain dressing. Disposition:  Patient tolerated procedure well. Patient to return for ongoing wound care as noted below.     Return in about 3 weeks (around 06/21/2024) for wound care.

## 2024-06-09 LAB — AEROBIC CULTURE
MICRO NUMBER:: 17036080
SPECIMEN QUALITY:: ADEQUATE

## 2024-06-09 LAB — HOUSE ACCOUNT TRACKING

## 2024-06-23 ENCOUNTER — Ambulatory Visit: Admitting: Podiatry

## 2024-06-23 VITALS — Ht 67.0 in | Wt 192.0 lb

## 2024-06-23 DIAGNOSIS — L97529 Non-pressure chronic ulcer of other part of left foot with unspecified severity: Secondary | ICD-10-CM | POA: Diagnosis not present

## 2024-06-23 DIAGNOSIS — E11621 Type 2 diabetes mellitus with foot ulcer: Secondary | ICD-10-CM | POA: Diagnosis not present

## 2024-06-26 ENCOUNTER — Encounter: Payer: Self-pay | Admitting: Podiatry

## 2024-06-26 NOTE — Progress Notes (Signed)
 Subjective:  Patient ID: Kristina Harris, female    DOB: April 23, 1961,  MRN: [id number]  Chief Complaint  Patient presents with   Wound Check    Rm 6 Patient is here ulcer of the left great toe. Ulcer is scabbed over with no drainage.     63 y.o. female presents with the above complaint. History confirmed with patient.  She returns for follow-up has developed re ulceration at her previous amputation site  Objective:  Physical Exam: warm, good capillary refill, normal DP and PT pulses, and diffuse peripheral neuropathy with minimal pedal sensation, has full-thickness ulceration measuring 0.3 x 0.3 x 0.2 cm. Granular wound bed surrounding hyperkeratosis no purulence or malodor     Radiographs: Multiple views x-ray of the left foot: no soft tissue emphysema, no signs of osteomyelitis Assessment:   1. Diabetic ulcer of left great toe [facility])       Plan:  Patient was evaluated and treated and all questions answered.   Ulcer left great toe -We discussed the etiology and factors that are a part of the wound healing process.  We also discussed the risk of infection both soft tissue and osteomyelitis from open ulceration.  Discussed the risk of limb loss if this happens or worsens. -Debridement as below. -Dressed with Iodosorb, DSD. -Continue home dressing changes daily with bandaid-type dressing and gentamicin  -Continue off-loading with surgical shoe.    -Vascular testing she is strong palpable pulses -HgbA1c: She has not had her A1c checked she is in the middle of transitioning from previous PCP to a new doctor -Last antibiotics: Healing well had Streptococcus in culture, do not see indication for oral antibiotics at this point -Imaging: x-ray reviewed, shows no signs of erosions, osteolysis, osteomyelitis or emphysema.  Procedure: Excisional Debridement of Wound Tool: Sharp #312 chisel blade/tissue nipper Type of Debridement: Sharp Excisional Frequency: @periodically   until appropriately healed.  Dressing is to be changed daily/keeping the wound clean and dry Rationale: Removal of non-viable soft tissue from the wound to promote healing.  Anesthesia: none Pre-Debridement Wound Measurements: 0.1 x 0.2 x 0.1 Post-Debridement Wound Measurements: 0.3 cm x 0.3 cm x 0.3 cm  Area devitalized tissue removed(nonviable tissue only): 0.2 cm x 0.2 cm.  Blood loss: Minimal (<50cc) Hemostasis: manual pressure Depth of Debridement: with fat layer exposed Description of tissue removed: Slough, Devitalized Tissue, and Non-viable tissue Technique: The wound and the surrounding skin were prepped in usual aseptic fashion.  Aseptic technique was maintained throughout the procedure.  Using #312 blade/tissue nipper sharp debridement of necrotic/nonviable tissue was performed until healthy bleeding wound bed was achieved.  No underlying bone or tendon was exposed during debridement.  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  Debridement removed 0.2 x 0.2 cm of the necrotic tissue and subcutaneous tissue and no active drainage present Presence/absence of tissue: Necrotic tissue/nonviable tissue present at the base of the wound.  Sharp debridement was performed to remove the necrotic tissue/nonviable tissue back to viable tissue.  No devitalized/nonviable tissue present postdebridement.  Wound appeared clean and clear of infection No material in the wound was present that was identified to be inhibiting healing. Dressing: Dry, sterile, dressing with Iodosorb on ulcer base followed by Biatain dressing. Disposition: Patient tolerated procedure well. Patient to return for ongoing wound care as noted below.     Return in about 3 weeks (  around 07/14/2024) for wound care.

## 2024-07-14 ENCOUNTER — Ambulatory Visit (INDEPENDENT_AMBULATORY_CARE_PROVIDER_SITE_OTHER): Admitting: Podiatry

## 2024-07-14 VITALS — Ht 67.0 in | Wt 192.0 lb

## 2024-07-14 DIAGNOSIS — L97529 Non-pressure chronic ulcer of other part of left foot with unspecified severity: Secondary | ICD-10-CM

## 2024-07-14 DIAGNOSIS — E11621 Type 2 diabetes mellitus with foot ulcer: Secondary | ICD-10-CM | POA: Diagnosis not present

## 2024-07-17 ENCOUNTER — Encounter: Payer: Self-pay | Admitting: Podiatry

## 2024-07-17 NOTE — Progress Notes (Signed)
  Subjective:  Patient ID: Kristina Harris, female    DOB: [birth year],  MRN: [id number]  Chief Complaint  Patient presents with   Wound Check    RM 15 Return in about 3 weeks (around 07/14/2024) for wound care of diabetic ulcer of left great toe.Wound is scabbed over with no visible drainage.    63 y.o. female presents with the above complaint. History confirmed with patient.  She returns for follow-up has developed re ulceration at her previous amputation site  Objective:  Physical Exam: warm, good capillary refill, normal DP and PT pulses, and diffuse peripheral neuropathy with minimal pedal sensation, wound is healed with only minimal skin breakdown Granular wound bed surrounding hyperkeratosis no purulence or malodor     Radiographs: Multiple views x-ray of the left foot: no soft tissue emphysema, no signs of osteomyelitis Assessment:   1. Diabetic ulcer of left great toe [facility])        Plan:  Patient was evaluated and treated and all questions answered.   Ulcer left great toe - Doing very well and is nearly fully healed.  Has had minimal skin breakdown she will continue to lysing a surgical shoe and should be able to gradually return to shoe gear in the near future as well as the need for custom foot orthosis molding.  She will follow-up with us  in 3 weeks to reevaluate.  Continue local wound care as scheduled.     Return in about 4 weeks (around 08/11/2024) for wound care.

## 2024-08-11 ENCOUNTER — Ambulatory Visit (INDEPENDENT_AMBULATORY_CARE_PROVIDER_SITE_OTHER): Admitting: Podiatry

## 2024-08-11 VITALS — Ht 67.0 in | Wt 192.0 lb

## 2024-08-11 DIAGNOSIS — M216X2 Other acquired deformities of left foot: Secondary | ICD-10-CM

## 2024-08-11 DIAGNOSIS — M216X1 Other acquired deformities of right foot: Secondary | ICD-10-CM | POA: Diagnosis not present

## 2024-08-11 DIAGNOSIS — L97529 Non-pressure chronic ulcer of other part of left foot with unspecified severity: Secondary | ICD-10-CM

## 2024-08-11 DIAGNOSIS — E11621 Type 2 diabetes mellitus with foot ulcer: Secondary | ICD-10-CM

## 2024-08-11 NOTE — Patient Instructions (Signed)
 Look for urea 40% cream or ointment and apply to the thickened dry skin / calluses. This can be bought over the counter, at a pharmacy or online such as [employer].

## 2024-08-11 NOTE — Progress Notes (Signed)
°  Subjective:  Patient ID: Kristina Harris, female    DOB: 08-04-1961,  MRN: [id number]  Chief Complaint  Patient presents with   Diabetic Ulcer    RM 7 Patient is here for diabetic ulcer of the left great toe. Wound is scabbed over with no visible drainage.    63 y.o. female presents with the above complaint. History confirmed with patient.  She returns for follow-up doing very well has not had any drainage  Objective:  Physical Exam: warm, good capillary refill, normal DP and PT pulses, and diffuse peripheral neuropathy with minimal pedal sensation, wound is healed with diffuse callus     Radiographs: Multiple views x-ray of the left foot: no soft tissue emphysema, no signs of osteomyelitis Assessment:   1. Pronation of left foot   2. Pronation of right foot   3. Diabetic ulcer of left great toe [facility])         Plan:  Patient was evaluated and treated and all questions answered.   Ulcer left great toe - Overall continues to do very well.  Her wound is fully healed.  Long-term I recommended using urea cream to keep the callus tissue soft.  She will need a custom molded foot orthosis to alleviate pressure and reduce her chance of reulceration.  She was scanned for these today and these were sent to [facility] orthotics with an accommodative type with an offload submetatarsal 1.  She will follow-up with me in 5 weeks for orthotic pickup and wound check   Return in about 5 weeks (around 09/15/2024) for wound check, orthotics pick up .

## 2024-08-31 ENCOUNTER — Telehealth: Payer: Self-pay | Admitting: Podiatry

## 2024-08-31 NOTE — Telephone Encounter (Signed)
 Orthotics are in [place] office. Appointment is scheduled for 09/15/2024 to pick up w/Dr. Silva

## 2024-09-15 ENCOUNTER — Ambulatory Visit: Admitting: Podiatry

## 2024-09-15 VITALS — Ht 67.0 in | Wt 192.0 lb

## 2024-09-15 DIAGNOSIS — L97529 Non-pressure chronic ulcer of other part of left foot with unspecified severity: Secondary | ICD-10-CM | POA: Diagnosis not present

## 2024-09-15 DIAGNOSIS — M216X2 Other acquired deformities of left foot: Secondary | ICD-10-CM | POA: Diagnosis not present

## 2024-09-15 DIAGNOSIS — E11621 Type 2 diabetes mellitus with foot ulcer: Secondary | ICD-10-CM | POA: Diagnosis not present

## 2024-09-15 DIAGNOSIS — M216X1 Other acquired deformities of right foot: Secondary | ICD-10-CM

## 2024-09-16 ENCOUNTER — Encounter: Payer: Self-pay | Admitting: Podiatry

## 2024-09-16 NOTE — Progress Notes (Signed)
"  °  Subjective:  Patient ID: Kristina Harris, female    DOB: 1960-09-23,  MRN: [id number]  Chief Complaint  Patient presents with   Wound Check    RM 21 Patient is here for wound of the right hallux. The wound is scabbed over with no drainage.    64 y.o. female presents with the above complaint. History confirmed with patient.  She returns for follow-up doing very well has not had any drainage  Objective:  Physical Exam: warm, good capillary refill, normal DP and PT pulses, and diffuse peripheral neuropathy with minimal pedal sensation, wound is healed with diffuse callus, no recurrence of ulceration     Radiographs: Multiple views x-ray of the left foot: no soft tissue emphysema, no signs of osteomyelitis Assessment:   1. Diabetic ulcer of left great toe (HCC)   2. Pronation of right foot   3. Pronation of left foot         Plan:  Patient was evaluated and treated and all questions answered.   Ulcer left great toe - Overall continues to do very well.  Her wound is fully healed.  Long-term I recommended using urea cream to keep the callus tissue soft.  Callus sharply debrided with a scalpel to reduce prominence.  Her orthotics were ready for dispensation today, they will assess for form fit and function.  She will let me know if they need any adjustments.  Follow-up in 6 weeks to reevaluate   No follow-ups on file.   "

## 2024-10-20 ENCOUNTER — Ambulatory Visit: Admitting: Podiatry
